# Patient Record
Sex: Female | Born: 1990 | Marital: Married | Smoking: Never smoker
Health system: Southern US, Community
[De-identification: ages and names within clinical notes are randomized; demographics above are authoritative.]

## PROBLEM LIST (undated history)

## (undated) DIAGNOSIS — O24419 Gestational diabetes mellitus in pregnancy, unspecified control: Secondary | ICD-10-CM

## (undated) HISTORY — DX: Gestational diabetes mellitus in pregnancy, unspecified control: O24.419

---

## 2019-02-14 DIAGNOSIS — L253 Unspecified contact dermatitis due to other chemical products: Secondary | ICD-10-CM | POA: Diagnosis not present

## 2019-04-09 DIAGNOSIS — Z20828 Contact with and (suspected) exposure to other viral communicable diseases: Secondary | ICD-10-CM | POA: Diagnosis not present

## 2019-09-15 DIAGNOSIS — U071 COVID-19: Secondary | ICD-10-CM | POA: Diagnosis not present

## 2019-11-17 DIAGNOSIS — Z6825 Body mass index (BMI) 25.0-25.9, adult: Secondary | ICD-10-CM | POA: Diagnosis not present

## 2019-11-17 DIAGNOSIS — Z113 Encounter for screening for infections with a predominantly sexual mode of transmission: Secondary | ICD-10-CM | POA: Diagnosis not present

## 2019-11-17 DIAGNOSIS — Z0000 Encounter for general adult medical examination without abnormal findings: Secondary | ICD-10-CM | POA: Diagnosis not present

## 2019-11-17 DIAGNOSIS — Z124 Encounter for screening for malignant neoplasm of cervix: Secondary | ICD-10-CM | POA: Diagnosis not present

## 2019-11-22 DIAGNOSIS — Z23 Encounter for immunization: Secondary | ICD-10-CM | POA: Diagnosis not present

## 2020-03-01 ENCOUNTER — Encounter: Payer: Self-pay | Admitting: Advanced Practice Midwife

## 2020-03-01 ENCOUNTER — Ambulatory Visit (INDEPENDENT_AMBULATORY_CARE_PROVIDER_SITE_OTHER): Payer: BC Managed Care – PPO | Admitting: Advanced Practice Midwife

## 2020-03-01 ENCOUNTER — Other Ambulatory Visit: Payer: Self-pay

## 2020-03-01 ENCOUNTER — Other Ambulatory Visit (HOSPITAL_COMMUNITY)
Admission: RE | Admit: 2020-03-01 | Discharge: 2020-03-01 | Disposition: A | Discharge status: Home / Self Care | Payer: BC Managed Care – PPO | Source: Ambulatory Visit | Attending: Advanced Practice Midwife | Admitting: Advanced Practice Midwife

## 2020-03-01 DIAGNOSIS — Z3401 Encounter for supervision of normal first pregnancy, first trimester: Secondary | ICD-10-CM

## 2020-03-01 DIAGNOSIS — Z3A08 8 weeks gestation of pregnancy: Secondary | ICD-10-CM | POA: Diagnosis not present

## 2020-03-01 DIAGNOSIS — Z3400 Encounter for supervision of normal first pregnancy, unspecified trimester: Secondary | ICD-10-CM | POA: Insufficient documentation

## 2020-03-01 NOTE — Patient Instructions (Signed)
Genetic Testing During Pregnancy Genetic testing during pregnancy is also called prenatal genetic testing. This type of testing can determine if your baby is at risk of being born with a disorder caused by abnormal genes or chromosomes (genetic disorder). Chromosomes contain genes that control how your baby will develop in your womb. There are many different genetic disorders. Examples of genetic disorders that may be found through genetic testing include Down syndrome and cystic fibrosis. Gene changes (mutations) can be passed down through families. Genetic testing is offered to all women before or during pregnancy. You can choose whether to have genetic testing. Why is genetic testing done? Genetic testing is done during pregnancy to find out whether your child is at risk for a genetic disorder. Having genetic testing allows you to:  Discuss your test results and options with a genetic counselor.  Prepare for a baby that may be born with a genetic disorder. Learning about the disorder ahead of time helps you be better prepared to manage it. Your health care providers can also be prepared in case your baby requires special care before or after birth.  Consider whether you want to continue with the pregnancy. In some cases, genetic testing may be done to learn about the traits a child will inherit. Types of genetic tests There are two basic types of genetic testing. Screening tests indicate whether your developing baby (fetus) is at higher risk for a genetic disorder. Diagnostic tests check actual fetal cells to diagnose a genetic disorder. Screening tests     Screening tests will not harm your baby. They are recommended for all pregnant women. Types of screening tests include:  Carrier screening. This test involves checking genes from both parents by testing their blood or saliva. The test checks to find out if the parents carry a genetic mutation that may be passed to a baby. In most cases,  both parents must carry the mutation for a baby to be at risk.  First trimester screening. This test combines a blood test with sound wave imaging of your baby (fetal ultrasound). This screening test checks for a risk of Down syndrome or other defects caused by having extra chromosomes. It also checks for defects of the heart, abdomen, or skeleton.  Second trimester screening also combines a blood test with a fetal ultrasound exam. It checks for a risk of genetic defects of the face, brain, spine, heart, or limbs.  Combined or sequential screening. This type of testing combines the results of first and second trimester screening. This type of testing may be more accurate than first or second trimester screening alone.  Cell-free DNA testing. This is a blood test that detects cells released by the placenta that get into the mother's blood. It can be used to check for a risk of Down syndrome, other extra chromosome syndromes, and disorders caused by abnormal numbers of sex chromosomes. This test can be done any time after 10 weeks of pregnancy.  Diagnostic tests Diagnostic tests carry slight risks of problems, including bleeding, infection, and loss of the pregnancy. These tests are done only if your baby is at risk for a genetic disorder. You may meet with a genetic counselor to discuss the risks and benefits before having diagnostic tests. Examples of diagnostic tests include:  Chorionic villus sampling (CVS). This involves a procedure to remove and test a sample of cells taken from the placenta. The procedure may be done between 10 and 12 weeks of pregnancy.  Amniocentesis. This involves a   procedure to remove and test a sample of fluid (amniotic fluid) and cells from the sac that surrounds the developing baby. The procedure may be done between 15 and 20 weeks of pregnancy. What do the results mean? For a screening test:  If the results are negative, it often means that your child is not at higher  risk. There is still a slight chance your child could have a genetic disorder.  If the results are positive, it does not mean your child will have a genetic disorder. It may mean that your child has a higher-than-normal risk for a genetic disorder. In that case, you may want to talk with a genetic counselor about whether you should have diagnostic genetic tests. For a diagnostic test:  If the result is negative, it is unlikely that your child will have a genetic disorder.  If the test is positive for a genetic disorder, it is likely that your child will have the disorder. The test may not tell how severe the disorder will be. Talk with your health care provider about your options. Questions to ask your health care provider Before talking to your health care provider about genetic testing, find out if there is a history of genetic disorders in your family. It may also help to know your family's ethnic origins. Then ask your health care provider the following questions:  Is my baby at risk for a genetic disorder?  What are the benefits of having genetic screening?  What tests are best for me and my baby?  What are the risks of each test?  If I get a positive result on a screening test, what is the next step?  Should I meet with a genetic counselor before having a diagnostic test?  Should my partner or other members of my family be tested?  How much do the tests cost? Will my insurance cover the testing? Summary  Genetic testing is done during pregnancy to find out whether your child is at risk for a genetic disorder.  Genetic testing is offered to all women before or during pregnancy. You can choose whether to have genetic testing.  There are two basic types of genetic testing. Screening tests indicate whether your developing baby (fetus) is at higher risk for a genetic disorder. Diagnostic tests check actual fetal cells to diagnose a genetic disorder.  If a diagnostic genetic test is  positive, talk with your health care provider about your options. This information is not intended to replace advice given to you by your health care provider. Make sure you discuss any questions you have with your health care provider. Document Revised: 12/18/2018 Document Reviewed: 11/11/2017 Elsevier Patient Education  2020 [employer] First Trimester of Pregnancy The first trimester of pregnancy is from week 1 until the end of week 13 (months 1 through 3). A week after a sperm fertilizes an egg, the egg will implant on the wall of the uterus. This embryo will begin to develop into a baby. Genes from you and your partner will form the baby. The female genes will determine whether the baby will be a boy or a girl. At 6-8 weeks, the eyes and face will be formed, and the heartbeat can be seen on ultrasound. At the end of 12 weeks, all the baby's organs will be formed. Now that you are pregnant, you will want to do everything you can to have a healthy baby. Two of the most important things are to get good prenatal care and to   follow your health care provider's instructions. Prenatal care is all the medical care you receive before the baby's birth. This care will help prevent, find, and treat any problems during the pregnancy and childbirth. Body changes during your first trimester Your body goes through many changes during pregnancy. The changes vary from woman to woman.  You may gain or lose a couple of pounds at first.  You may feel sick to your stomach (nauseous) and you may throw up (vomit). If the vomiting is uncontrollable, call your health care provider.  You may tire easily.  You may develop headaches that can be relieved by medicines. All medicines should be approved by your health care provider.  You may urinate more often. Painful urination may mean you have a bladder infection.  You may develop heartburn as a result of your pregnancy.  You may develop constipation because certain  hormones are causing the muscles that push stool through your intestines to slow down.  You may develop hemorrhoids or swollen veins (varicose veins).  Your breasts may begin to grow larger and become tender. Your nipples may stick out more, and the tissue that surrounds them (areola) may become darker.  Your gums may bleed and may be sensitive to brushing and flossing.  Dark spots or blotches (chloasma, mask of pregnancy) may develop on your face. This will likely fade after the baby is born.  Your menstrual periods will stop.  You may have a loss of appetite.  You may develop cravings for certain kinds of food.  You may have changes in your emotions from day to day, such as being excited to be pregnant or being concerned that something may go wrong with the pregnancy and baby.  You may have more vivid and strange dreams.  You may have changes in your hair. These can include thickening of your hair, rapid growth, and changes in texture. Some women also have hair loss during or after pregnancy, or hair that feels dry or thin. Your hair will most likely return to normal after your baby is born. What to expect at prenatal visits During a routine prenatal visit:  You will be weighed to make sure you and the baby are growing normally.  Your blood pressure will be taken.  Your abdomen will be measured to track your baby's growth.  The fetal heartbeat will be listened to between weeks 10 and 14 of your pregnancy.  Test results from any previous visits will be discussed. Your health care provider may ask you:  How you are feeling.  If you are feeling the baby move.  If you have had any abnormal symptoms, such as leaking fluid, bleeding, severe headaches, or abdominal cramping.  If you are using any tobacco products, including cigarettes, chewing tobacco, and electronic cigarettes.  If you have any questions. Other tests that may be performed during your first trimester  include:  Blood tests to find your blood type and to check for the presence of any previous infections. The tests will also be used to check for low iron levels (anemia) and protein on red blood cells (Rh antibodies). Depending on your risk factors, or if you previously had diabetes during pregnancy, you may have tests to check for high blood sugar that affects pregnant women (gestational diabetes).  Urine tests to check for infections, diabetes, or protein in the urine.  An ultrasound to confirm the proper growth and development of the baby.  Fetal screens for spinal cord problems (spina bifida) and   Down syndrome.  HIV (human immunodeficiency virus) testing. Routine prenatal testing includes screening for HIV, unless you choose not to have this test.  You may need other tests to make sure you and the baby are doing well. Follow these instructions at home: Medicines  Follow your health care provider's instructions regarding medicine use. Specific medicines may be either safe or unsafe to take during pregnancy.  Take a prenatal vitamin that contains at least 600 micrograms (mcg) of folic acid.  If you develop constipation, try taking a stool softener if your health care provider approves. Eating and drinking   Eat a balanced diet that includes fresh fruits and vegetables, whole grains, good sources of protein such as meat, eggs, or tofu, and low-fat dairy. Your health care provider will help you determine the amount of weight gain that is right for you.  Avoid raw meat and uncooked cheese. These carry germs that can cause birth defects in the baby.  Eating four or five small meals rather than three large meals a day may help relieve nausea and vomiting. If you start to feel nauseous, eating a few soda crackers can be helpful. Drinking liquids between meals, instead of during meals, also seems to help ease nausea and vomiting.  Limit foods that are high in fat and processed sugars, such  as fried and sweet foods.  To prevent constipation: ? Eat foods that are high in fiber, such as fresh fruits and vegetables, whole grains, and beans. ? Drink enough fluid to keep your urine clear or pale yellow. Activity  Exercise only as directed by your health care provider. Most women can continue their usual exercise routine during pregnancy. Try to exercise for 30 minutes at least 5 days a week. Exercising will help you: ? Control your weight. ? Stay in shape. ? Be prepared for labor and delivery.  Experiencing pain or cramping in the lower abdomen or lower back is a good sign that you should stop exercising. Check with your health care provider before continuing with normal exercises.  Try to avoid standing for long periods of time. Move your legs often if you must stand in one place for a long time.  Avoid heavy lifting.  Wear low-heeled shoes and practice good posture.  You may continue to have sex unless your health care provider tells you not to. Relieving pain and discomfort  Wear a good support bra to relieve breast tenderness.  Take warm sitz baths to soothe any pain or discomfort caused by hemorrhoids. Use hemorrhoid cream if your health care provider approves.  Rest with your legs elevated if you have leg cramps or low back pain.  If you develop varicose veins in your legs, wear support hose. Elevate your feet for 15 minutes, 3-4 times a day. Limit salt in your diet. Prenatal care  Schedule your prenatal visits by the twelfth week of pregnancy. They are usually scheduled monthly at first, then more often in the last 2 months before delivery.  Write down your questions. Take them to your prenatal visits.  Keep all your prenatal visits as told by your health care provider. This is important. Safety  Wear your seat belt at all times when driving.  Make a list of emergency phone numbers, including numbers for family, friends, the hospital, and police and fire  departments. General instructions  Ask your health care provider for a referral to a local prenatal education class. Begin classes no later than the beginning of month 6 of your   pregnancy.  Ask for help if you have counseling or nutritional needs during pregnancy. Your health care provider can offer advice or refer you to specialists for help with various needs.  Do not use hot tubs, steam rooms, or saunas.  Do not douche or use tampons or scented sanitary pads.  Do not cross your legs for long periods of time.  Avoid cat litter boxes and soil used by cats. These carry germs that can cause birth defects in the baby and possibly loss of the fetus by miscarriage or stillbirth.  Avoid all smoking, herbs, alcohol, and medicines not prescribed by your health care provider. Chemicals in these products affect the formation and growth of the baby.  Do not use any products that contain nicotine or tobacco, such as cigarettes and e-cigarettes. If you need help quitting, ask your health care provider. You may receive counseling support and other resources to help you quit.  Schedule a dentist appointment. At home, brush your teeth with a soft toothbrush and be gentle when you floss. Contact a health care provider if:  You have dizziness.  You have mild pelvic cramps, pelvic pressure, or nagging pain in the abdominal area.  You have persistent nausea, vomiting, or diarrhea.  You have a bad smelling vaginal discharge.  You have pain when you urinate.  You notice increased swelling in your face, hands, legs, or ankles.  You are exposed to fifth disease or chickenpox.  You are exposed to German measles (rubella) and have never had it. Get help right away if:  You have a fever.  You are leaking fluid from your vagina.  You have spotting or bleeding from your vagina.  You have severe abdominal cramping or pain.  You have rapid weight gain or loss.  You vomit blood or material that  looks like coffee grounds.  You develop a severe headache.  You have shortness of breath.  You have any kind of trauma, such as from a fall or a car accident. Summary  The first trimester of pregnancy is from week 1 until the end of week 13 (months 1 through 3).  Your body goes through many changes during pregnancy. The changes vary from woman to woman.  You will have routine prenatal visits. During those visits, your health care provider will examine you, discuss any test results you may have, and talk with you about how you are feeling. This information is not intended to replace advice given to you by your health care provider. Make sure you discuss any questions you have with your health care provider. Document Revised: 08/09/2017 Document Reviewed: 08/08/2016 Elsevier Patient Education  2020 [employer] Inc[employer]  

## 2020-03-01 NOTE — Progress Notes (Signed)
byDATING AND VIABILITY SONOGRAM   Patricia Delgado is a 29 y.o. year old No obstetric history on file. with LMP 12-28-2019 which would correlate to [redacted]w[redacted]d gestation.  She has regular menstrual cycles.   She is here today for a confirmatory initial sonogram.    GESTATION: SINGLETON   FETAL ACTIVITY:          Heart rate        172 bpm          The fetus is active.   Did note a 4.48 cm pocket of fluid anterior to the gestational sac. Patient did note she did have some dark brown blood on one occurrence.    GESTATIONAL AGE AND  BIOMETRICS:  Gestational criteria: Estimated Date of Delivery: None noted. by LMP now at 9-1  Previous Scans:0  GESTATIONAL SAC           1.72 cm         8-1 weeks  CROWN RUMP LENGTH           cm         8-0 weeks                                                                               AVERAGE EGA(BY THIS SCAN): 8-1 weeks  WORKING EDD( LMP ):  10-03-2020     TECHNICIAN COMMENTS: Patient informed that the ultrasound is considered a limited obstetric ultrasound and is not intended to be a complete ultrasound exam. Patient also informed that the ultrasound is not being completed with the intent of assessing for fetal or placental anomalies or any pelvic abnormalities. Explained that the purpose of today's ultrasound is to assess for fetal heart rate. Patient acknowledges the purpose of the exam and the limitations of the study.     Armandina Stammer 03/01/2020 9:21 AM   Patient answered the Get ready [place] questionnaire on the ipad. Armandina Stammer RN

## 2020-03-01 NOTE — Progress Notes (Signed)
°  Subjective:    Patricia Delgado is a G1P0 [redacted]w[redacted]d being seen today for her first obstetrical visit.  Her obstetrical history is significant for primigravida. Patient does intend to breast feed. Pregnancy history fully reviewed.  Patient reports nausea.  Vitals:   03/01/20 0929 03/01/20 0929  BP: 113/67   Pulse: 77   Weight: 130 lb (59 kg)   Height:  4\' 10"  (1.473 m)    HISTORY: OB History  Gravida Para Term Preterm AB Living  1            SAB TAB Ectopic Multiple Live Births               # Outcome Date GA Lbr Len/2nd Weight Sex Delivery Anes PTL Lv  1 Current            History reviewed. No pertinent past medical history. History reviewed. No pertinent surgical history. Family History  Problem Relation Age of Onset   Hypertension Maternal Grandfather    Stroke Maternal Grandfather    Diabetes Paternal Grandmother    Cancer Neg Hx      Exam    Uterus:     Pelvic Exam:    Perineum: No Hemorrhoids, Normal Perineum   Vulva: Bartholin's, Urethra, Skene's normal   Vagina:  normal discharge   pH:    Cervix: no cervical motion tenderness   Adnexa: no mass, fullness, tenderness   Bony Pelvis: gynecoid  System: Breast:  normal appearance, no masses or tenderness  Bilateral accessory nipples just below breasts   Skin: normal coloration and turgor, no rashes    Neurologic: oriented, grossly non-focal   Extremities: normal strength, tone, and muscle mass   HEENT neck supple with midline trachea   Mouth/Teeth mucous membranes moist, pharynx normal without lesions   Neck supple and no masses   Cardiovascular: regular rate and rhythm   Respiratory:  appears well, vitals normal, no respiratory distress, acyanotic, normal RR, ear and throat exam is normal, neck free of mass or lymphadenopathy, chest clear, no wheezing, crepitations, rhonchi, normal symmetric air entry   Abdomen: soft, non-tender; bowel sounds normal; no masses,  no organomegaly   Urinary: urethral meatus normal       Assessment:    Pregnancy: G1P0 Patient Active Problem List   Diagnosis Date Noted   Supervision of normal first pregnancy 03/01/2020        Plan:    Problem list reviewed and updated. Genetic Screening discussed Panorama/AFP: requested.  Ultrasound discussed; fetal survey: requested.  Follow up in 4 weeks. 50% of 30 min visit spent on counseling and coordination of care.    Initial labs drawn. Continue prenatal vitamins. Discussed and offered genetic screening options, including Quad screen/AFP, NIPS testing, and option to decline testing. Benefits/risks/alternatives reviewed. Pt aware that anatomy 03/03/2020 is form of genetic screening with lower accuracy in detecting trisomies than blood work.  Pt chooses genetic screening today. NIPS: ordered. Ultrasound discussed; fetal anatomic survey: ordered. Problem list reviewed and updated. The nature of [facility] with multiple MDs and other Advanced Practice Providers was explained to patient; also emphasized that residents, students are part of our team. Routine obstetric precautions reviewed.    [facility] 03/01/2020

## 2020-03-02 LAB — GC/CHLAMYDIA PROBE AMP (~~LOC~~) NOT AT ARMC
Chlamydia: NEGATIVE
Comment: NEGATIVE
Comment: NORMAL
Neisseria Gonorrhea: NEGATIVE

## 2020-03-03 LAB — URINE CULTURE: Organism ID, Bacteria: NO GROWTH

## 2020-03-03 LAB — CBC/D/PLT+RPR+RH+ABO+RUB AB...
Antibody Screen: NEGATIVE
Basophils Absolute: 0 10*3/uL (ref 0.0–0.2)
Basos: 0 %
EOS (ABSOLUTE): 0 10*3/uL (ref 0.0–0.4)
Eos: 0 %
HCV Ab: <0.1 s/co ratio (ref 0.0–0.9)
HIV Screen 4th Generation wRfx: NONREACTIVE
Hematocrit: 40.1 % (ref 34.0–46.6)
Hemoglobin: 13.2 g/dL (ref 11.1–15.9)
Hepatitis B Surface Ag: NEGATIVE
Immature Grans (Abs): 0.1 10*3/uL (ref 0.0–0.1)
Immature Granulocytes: 1 %
Lymphocytes Absolute: 2.2 10*3/uL (ref 0.7–3.1)
Lymphs: 23 %
MCH: 29.6 pg (ref 26.6–33.0)
MCHC: 32.9 g/dL (ref 31.5–35.7)
MCV: 90 fL (ref 79–97)
Monocytes Absolute: 0.5 10*3/uL (ref 0.1–0.9)
Monocytes: 6 %
Neutrophils Absolute: 6.6 10*3/uL (ref 1.4–7.0)
Neutrophils: 70 %
Platelets: 293 10*3/uL (ref 150–450)
RBC: 4.46 x10E6/uL (ref 3.77–5.28)
RDW: 12.5 % (ref 11.7–15.4)
RPR Ser Ql: NONREACTIVE
Rh Factor: POSITIVE
Rubella Antibodies, IGG: 1.19 index (ref >0.99)
WBC: 9.5 10*3/uL (ref 3.4–10.8)

## 2020-03-03 LAB — HCV INTERPRETATION

## 2020-03-08 ENCOUNTER — Encounter: Payer: Self-pay | Admitting: Advanced Practice Midwife

## 2020-03-29 ENCOUNTER — Ambulatory Visit (INDEPENDENT_AMBULATORY_CARE_PROVIDER_SITE_OTHER): Payer: BC Managed Care – PPO | Admitting: Advanced Practice Midwife

## 2020-03-29 ENCOUNTER — Other Ambulatory Visit: Payer: Self-pay

## 2020-03-29 ENCOUNTER — Encounter: Payer: Self-pay | Admitting: Advanced Practice Midwife

## 2020-03-29 VITALS — BP 109/59 | HR 79 | Wt 126.0 lb

## 2020-03-29 DIAGNOSIS — Z3400 Encounter for supervision of normal first pregnancy, unspecified trimester: Secondary | ICD-10-CM

## 2020-03-29 DIAGNOSIS — Z3482 Encounter for supervision of other normal pregnancy, second trimester: Secondary | ICD-10-CM | POA: Diagnosis not present

## 2020-03-29 DIAGNOSIS — Z3401 Encounter for supervision of normal first pregnancy, first trimester: Secondary | ICD-10-CM

## 2020-03-29 DIAGNOSIS — Z3A13 13 weeks gestation of pregnancy: Secondary | ICD-10-CM

## 2020-03-29 NOTE — Patient Instructions (Addendum)
Genetic Testing During Pregnancy Genetic testing during pregnancy is also called prenatal genetic testing. This type of testing can determine if your baby is at risk of being born with a disorder caused by abnormal genes or chromosomes (genetic disorder). Chromosomes contain genes that control how your baby will develop in your womb. There are many different genetic disorders. Examples of genetic disorders that may be found through genetic testing include Down syndrome and cystic fibrosis. Gene changes (mutations) can be passed down through families. Genetic testing is offered to all women before or during pregnancy. You can choose whether to have genetic testing. Why is genetic testing done? Genetic testing is done during pregnancy to find out whether your child is at risk for a genetic disorder. Having genetic testing allows you to:  Discuss your test results and options with a genetic counselor.  Prepare for a baby that may be born with a genetic disorder. Learning about the disorder ahead of time helps you be better prepared to manage it. Your health care providers can also be prepared in case your baby requires special care before or after birth.  Consider whether you want to continue with the pregnancy. In some cases, genetic testing may be done to learn about the traits a child will inherit. Types of genetic tests There are two basic types of genetic testing. Screening tests indicate whether your developing baby (fetus) is at higher risk for a genetic disorder. Diagnostic tests check actual fetal cells to diagnose a genetic disorder. Screening tests     Screening tests will not harm your baby. They are recommended for all pregnant women. Types of screening tests include:  Carrier screening. This test involves checking genes from both parents by testing their blood or saliva. The test checks to find out if the parents carry a genetic mutation that may be passed to a baby. In most cases,  both parents must carry the mutation for a baby to be at risk.  First trimester screening. This test combines a blood test with sound wave imaging of your baby (fetal ultrasound). This screening test checks for a risk of Down syndrome or other defects caused by having extra chromosomes. It also checks for defects of the heart, abdomen, or skeleton.  Second trimester screening also combines a blood test with a fetal ultrasound exam. It checks for a risk of genetic defects of the face, brain, spine, heart, or limbs.  Combined or sequential screening. This type of testing combines the results of first and second trimester screening. This type of testing may be more accurate than first or second trimester screening alone.  Cell-free DNA testing. This is a blood test that detects cells released by the placenta that get into the mother's blood. It can be used to check for a risk of Down syndrome, other extra chromosome syndromes, and disorders caused by abnormal numbers of sex chromosomes. This test can be done any time after 10 weeks of pregnancy.  Diagnostic tests Diagnostic tests carry slight risks of problems, including bleeding, infection, and loss of the pregnancy. These tests are done only if your baby is at risk for a genetic disorder. You may meet with a genetic counselor to discuss the risks and benefits before having diagnostic tests. Examples of diagnostic tests include:  Chorionic villus sampling (CVS). This involves a procedure to remove and test a sample of cells taken from the placenta. The procedure may be done between 10 and 12 weeks of pregnancy.  Amniocentesis. This involves a  procedure to remove and test a sample of fluid (amniotic fluid) and cells from the sac that surrounds the developing baby. The procedure may be done between 15 and 20 weeks of pregnancy. What do the results mean? For a screening test:  If the results are negative, it often means that your child is not at higher  risk. There is still a slight chance your child could have a genetic disorder.  If the results are positive, it does not mean your child will have a genetic disorder. It may mean that your child has a higher-than-normal risk for a genetic disorder. In that case, you may want to talk with a genetic counselor about whether you should have diagnostic genetic tests. For a diagnostic test:  If the result is negative, it is unlikely that your child will have a genetic disorder.  If the test is positive for a genetic disorder, it is likely that your child will have the disorder. The test may not tell how severe the disorder will be. Talk with your health care provider about your options. Questions to ask your health care provider Before talking to your health care provider about genetic testing, find out if there is a history of genetic disorders in your family. It may also help to know your family's ethnic origins. Then ask your health care provider the following questions:  Is my baby at risk for a genetic disorder?  What are the benefits of having genetic screening?  What tests are best for me and my baby?  What are the risks of each test?  If I get a positive result on a screening test, what is the next step?  Should I meet with a genetic counselor before having a diagnostic test?  Should my partner or other members of my family be tested?  How much do the tests cost? Will my insurance cover the testing? Summary  Genetic testing is done during pregnancy to find out whether your child is at risk for a genetic disorder.  Genetic testing is offered to all women before or during pregnancy. You can choose whether to have genetic testing.  There are two basic types of genetic testing. Screening tests indicate whether your developing baby (fetus) is at higher risk for a genetic disorder. Diagnostic tests check actual fetal cells to diagnose a genetic disorder.  If a diagnostic genetic test is  positive, talk with your health care provider about your options. This information is not intended to replace advice given to you by your health care provider. Make sure you discuss any questions you have with your health care provider. Document Revised: 12/18/2018 Document Reviewed: 11/11/2017 Elsevier Patient Education  2020 [employer]. Second Trimester of Pregnancy  The second trimester is from week 14 through week 27 (month 4 through 6). This is often the time in pregnancy that you feel your best. Often times, morning sickness has lessened or quit. You may have more energy, and you may get hungry more often. Your unborn baby is growing rapidly. At the end of the sixth month, he or she is about 9 inches long and weighs about 1 pounds. You will likely feel the baby move between 18 and 20 weeks of pregnancy. Follow these instructions at home: Medicines  Take over-the-counter and prescription medicines only as told by your doctor. Some medicines are safe and some medicines are not safe during pregnancy.  Take a prenatal vitamin that contains at least 600 micrograms (mcg) of folic acid.  If  you have trouble pooping (constipation), take medicine that will make your stool soft (stool softener) if your doctor approves. Eating and drinking   Eat regular, healthy meals.  Avoid raw meat and uncooked cheese.  If you get low calcium from the food you eat, talk to your doctor about taking a daily calcium supplement.  Avoid foods that are high in fat and sugars, such as fried and sweet foods.  If you feel sick to your stomach (nauseous) or throw up (vomit): ? Eat 4 or 5 small meals a day instead of 3 large meals. ? Try eating a few soda crackers. ? Drink liquids between meals instead of during meals.  To prevent constipation: ? Eat foods that are high in fiber, like fresh fruits and vegetables, whole grains, and beans. ? Drink enough fluids to keep your pee (urine) clear or pale  yellow. Activity  Exercise only as told by your doctor. Stop exercising if you start to have cramps.  Do not exercise if it is too hot, too humid, or if you are in a place of great height (high altitude).  Avoid heavy lifting.  Wear low-heeled shoes. Sit and stand up straight.  You can continue to have sex unless your doctor tells you not to. Relieving pain and discomfort  Wear a good support bra if your breasts are tender.  Take warm water baths (sitz baths) to soothe pain or discomfort caused by hemorrhoids. Use hemorrhoid cream if your doctor approves.  Rest with your legs raised if you have leg cramps or low back pain.  If you develop puffy, bulging veins (varicose veins) in your legs: ? Wear support hose or compression stockings as told by your doctor. ? Raise (elevate) your feet for 15 minutes, 3-4 times a day. ? Limit salt in your food. Prenatal care  Write down your questions. Take them to your prenatal visits.  Keep all your prenatal visits as told by your doctor. This is important. Safety  Wear your seat belt when driving.  Make a list of emergency phone numbers, including numbers for family, friends, the hospital, and police and fire departments. General instructions  Ask your doctor about the right foods to eat or for help finding a counselor, if you need these services.  Ask your doctor about local prenatal classes. Begin classes before month 6 of your pregnancy.  Do not use hot tubs, steam rooms, or saunas.  Do not douche or use tampons or scented sanitary pads.  Do not cross your legs for long periods of time.  Visit your dentist if you have not done so. Use a soft toothbrush to brush your teeth. Floss gently.  Avoid all smoking, herbs, and alcohol. Avoid drugs that are not approved by your doctor.  Do not use any products that contain nicotine or tobacco, such as cigarettes and e-cigarettes. If you need help quitting, ask your doctor.  Avoid cat  litter boxes and soil used by cats. These carry germs that can cause birth defects in the baby and can cause a loss of your baby (miscarriage) or stillbirth. Contact a doctor if:  You have mild cramps or pressure in your lower belly.  You have pain when you pee (urinate).  You have bad smelling fluid coming from your vagina.  You continue to feel sick to your stomach (nauseous), throw up (vomit), or have watery poop (diarrhea).  You have a nagging pain in your belly area.  You feel dizzy. Get help right away if:    You have a fever.  You are leaking fluid from your vagina.  You have spotting or bleeding from your vagina.  You have severe belly cramping or pain.  You lose or gain weight rapidly.  You have trouble catching your breath and have chest pain.  You notice sudden or extreme puffiness (swelling) of your face, hands, ankles, feet, or legs.  You have not felt the baby move in over an hour.  You have severe headaches that do not go away when you take medicine.  You have trouble seeing. Summary  The second trimester is from week 14 through week 27 (months 4 through 6). This is often the time in pregnancy that you feel your best.  To take care of yourself and your unborn baby, you will need to eat healthy meals, take medicines only if your doctor tells you to do so, and do activities that are safe for you and your baby.  Call your doctor if you get sick or if you notice anything unusual about your pregnancy. Also, call your doctor if you need help with the right food to eat, or if you want to know what activities are safe for you. This information is not intended to replace advice given to you by your health care provider. Make sure you discuss any questions you have with your health care provider. Document Revised: 12/19/2018 Document Reviewed: 10/02/2016 Elsevier Patient Education  2020 [employer].

## 2020-03-29 NOTE — Progress Notes (Signed)
   PRENATAL VISIT NOTE  Subjective:  Patricia Delgado is a 29 y.o. G1P0 at [redacted]w[redacted]d being seen today for ongoing prenatal care.  She is currently monitored for the following issues for this low-risk pregnancy and has Supervision of normal first pregnancy on their problem list.  Patient reports nausea. Nausea is much less than before  Contractions: Not present. Vag. Bleeding: None.  Movement: Absent. Denies leaking of fluid.   The following portions of the patient's history were reviewed and updated as appropriate: allergies, current medications, past family history, past medical history, past social history, past surgical history and problem list.   Objective:   Vitals:   03/29/20 0922  BP: (!) 109/59  Pulse: 79  Weight: 126 lb (57.2 kg)    Fetal Status: Fetal Heart Rate (bpm): 152 Fundal Height: 13 cm Movement: Absent     General:  Alert, oriented and cooperative. Patient is in no acute distress.  Skin: Skin is warm and dry. No rash noted.   Cardiovascular: Normal heart rate noted  Respiratory: Normal respiratory effort, no problems with respiration noted  Abdomen: Soft, gravid, appropriate for gestational age.  Pain/Pressure: Absent     Pelvic: Cervical exam deferred        Extremities: Normal range of motion.  Edema: None  Mental Status: Normal mood and affect. Normal behavior. Normal judgment and thought content.   Assessment and Plan:  Pregnancy: G1P0 at [redacted]w[redacted]d 1. Supervision of normal first pregnancy, antepartum     Panorama today     AFP only next visit  - Genetic Screening - US MFM OB COMP + 14 WK; Future  Preterm labor symptoms and general obstetric precautions including but not limited to vaginal bleeding, contractions, leaking of fluid and fetal movement were reviewed in detail with the patient. Please refer to After Visit Summary for other counseling recommendations.   Return in about 4 weeks (around 04/26/2020), or 4-5 wks, for [facility].  Wynelle Bourgeois,  CNM

## 2020-04-05 ENCOUNTER — Telehealth: Payer: Self-pay

## 2020-04-05 NOTE — Telephone Encounter (Signed)
Pt called requesting Panorama results. Pt made aware that her results are low risk. Understanding was voiced. Pt did not want to know the gender.  Keleigh Kazee l Idil Maslanka, CMA

## 2020-04-21 DIAGNOSIS — Z03818 Encounter for observation for suspected exposure to other biological agents ruled out: Secondary | ICD-10-CM | POA: Diagnosis not present

## 2020-04-26 ENCOUNTER — Encounter: Payer: Self-pay | Admitting: Advanced Practice Midwife

## 2020-04-26 ENCOUNTER — Ambulatory Visit (INDEPENDENT_AMBULATORY_CARE_PROVIDER_SITE_OTHER): Payer: BC Managed Care – PPO | Admitting: Advanced Practice Midwife

## 2020-04-26 ENCOUNTER — Other Ambulatory Visit: Payer: Self-pay

## 2020-04-26 VITALS — BP 108/56 | HR 85 | Wt 128.0 lb

## 2020-04-26 DIAGNOSIS — Z3A17 17 weeks gestation of pregnancy: Secondary | ICD-10-CM | POA: Diagnosis not present

## 2020-04-26 DIAGNOSIS — Z3400 Encounter for supervision of normal first pregnancy, unspecified trimester: Secondary | ICD-10-CM

## 2020-04-26 NOTE — Progress Notes (Signed)
   PRENATAL VISIT NOTE  Subjective:  Patricia Delgado is a 29 y.o. G1P0 at [redacted]w[redacted]d being seen today for ongoing prenatal care.  She is currently monitored for the following issues for this low-risk pregnancy and has Supervision of normal first pregnancy on their problem list.  Patient reports occ itching in nipples.  Contractions: Not present. Vag. Bleeding: None.  Movement: Absent. Denies leaking of fluid.   The following portions of the patient's history were reviewed and updated as appropriate: allergies, current medications, past family history, past medical history, past social history, past surgical history and problem list.   Objective:   Vitals:   04/26/20 0912  BP: (!) 108/56  Pulse: 85  Weight: 128 lb (58.1 kg)    Fetal Status: Fetal Heart Rate (bpm): 150   Movement: Absent     General:  Alert, oriented and cooperative. Patient is in no acute distress.  Skin: Skin is warm and dry. No rash noted.   Cardiovascular: Normal heart rate noted  Respiratory: Normal respiratory effort, no problems with respiration noted  Abdomen: Soft, gravid, appropriate for gestational age.  Pain/Pressure: Present     Pelvic: Cervical exam deferred        Extremities: Normal range of motion.  Edema: None  Mental Status: Normal mood and affect. Normal behavior. Normal judgment and thought content.   Assessment and Plan:  Pregnancy: G1P0 at [redacted]w[redacted]d 1. [redacted] weeks gestation of pregnancy      Discussed use of Benadryl or OTC HC cream for itching  - AFP, Serum, Open Spina Bifida - [facility] AMB BABYSCRIPTS OPT IN  2. Supervision of normal first pregnancy, antepartum  - [facility] AMB BABYSCRIPTS OPT IN  3.  The patient was counseled on the potential benefits and lack of known risks of COVID vaccination, during pregnancy and breastfeeding, on today's visit. The patient's questions and concerns were addressed today The patient Has already received vaccine in March 2021.   4.   Prevention of back pain     Family  worried about her bending over. States she squats to pick things up  Back precautions reviewed.   Preterm labor symptoms and general obstetric precautions including but not limited to vaginal bleeding, contractions, leaking of fluid and fetal movement were reviewed in detail with the patient. Please refer to After Visit Summary for other counseling recommendations.   Return in about 4 weeks (around 05/24/2020) for [facility].  Future Appointments  Date Time Provider Department Center  05/09/2020 10:45 AM [facility] US5 [facility] [address] [facility]  05/24/2020  9:10 AM Aviva Signs, CNM [facility] None    Wynelle Bourgeois, CNM

## 2020-04-26 NOTE — Patient Instructions (Signed)

## 2020-04-28 LAB — AFP, SERUM, OPEN SPINA BIFIDA
AFP MoM: 0.69
AFP Value: 30.5 ng/mL
Gest. Age on Collection Date: 17.1 weeks
Maternal Age At EDD: 29.2 yr
OSBR Risk 1 IN: 10000
Test Results:: NEGATIVE
Weight: 128 [lb_av]

## 2020-05-09 ENCOUNTER — Ambulatory Visit: Payer: BC Managed Care – PPO | Attending: Advanced Practice Midwife

## 2020-05-09 ENCOUNTER — Other Ambulatory Visit: Payer: Self-pay | Admitting: Advanced Practice Midwife

## 2020-05-09 ENCOUNTER — Other Ambulatory Visit: Payer: Self-pay

## 2020-05-09 DIAGNOSIS — Z363 Encounter for antenatal screening for malformations: Secondary | ICD-10-CM

## 2020-05-09 DIAGNOSIS — Z3400 Encounter for supervision of normal first pregnancy, unspecified trimester: Secondary | ICD-10-CM | POA: Insufficient documentation

## 2020-05-09 DIAGNOSIS — Z3A19 19 weeks gestation of pregnancy: Secondary | ICD-10-CM | POA: Diagnosis not present

## 2020-05-09 DIAGNOSIS — O358XX0 Maternal care for other (suspected) fetal abnormality and damage, not applicable or unspecified: Secondary | ICD-10-CM

## 2020-05-10 ENCOUNTER — Encounter: Payer: Self-pay | Admitting: Advanced Practice Midwife

## 2020-05-10 DIAGNOSIS — O283 Abnormal ultrasonic finding on antenatal screening of mother: Secondary | ICD-10-CM | POA: Insufficient documentation

## 2020-05-24 ENCOUNTER — Ambulatory Visit (INDEPENDENT_AMBULATORY_CARE_PROVIDER_SITE_OTHER): Payer: BC Managed Care – PPO | Admitting: Advanced Practice Midwife

## 2020-05-24 ENCOUNTER — Encounter: Payer: Self-pay | Admitting: Advanced Practice Midwife

## 2020-05-24 ENCOUNTER — Other Ambulatory Visit: Payer: Self-pay

## 2020-05-24 VITALS — BP 107/60 | HR 92 | Wt 130.0 lb

## 2020-05-24 DIAGNOSIS — Z3A21 21 weeks gestation of pregnancy: Secondary | ICD-10-CM

## 2020-05-24 DIAGNOSIS — Z3400 Encounter for supervision of normal first pregnancy, unspecified trimester: Secondary | ICD-10-CM

## 2020-05-24 DIAGNOSIS — Z23 Encounter for immunization: Secondary | ICD-10-CM

## 2020-05-24 NOTE — Progress Notes (Signed)
° °  PRENATAL VISIT NOTE  Subjective:  Patricia Delgado is a 29 y.o. G1P0 at [redacted]w[redacted]d being seen today for ongoing prenatal care.  She is currently monitored for the following issues for this low-risk pregnancy and has Supervision of normal first pregnancy and Abnormal fetal ultrasound on their problem list.  Patient reports no complaints.  Contractions: Not present. Vag. Bleeding: None.  Movement: Present. Denies leaking of fluid.   The following portions of the patient's history were reviewed and updated as appropriate: allergies, current medications, past family history, past medical history, past social history, past surgical history and problem list.   Objective:   Vitals:   05/24/20 0915  BP: 107/60  Pulse: 92  Weight: 130 lb (59 kg)    Fetal Status: Fetal Heart Rate (bpm): 148   Movement: Present     General:  Alert, oriented and cooperative. Patient is in no acute distress.  Skin: Skin is warm and dry. No rash noted.   Cardiovascular: Normal heart rate noted  Respiratory: Normal respiratory effort, no problems with respiration noted  Abdomen: Soft, gravid, appropriate for gestational age.  Pain/Pressure: Absent     Pelvic: Cervical exam deferred        Extremities: Normal range of motion.  Edema: None  Mental Status: Normal mood and affect. Normal behavior. Normal judgment and thought content.   Assessment and Plan:  Pregnancy: G1P0 at [redacted]w[redacted]d 1. [redacted] weeks gestation of pregnancy     Has alreaday had Covid vaccine in March     Interested in getting Flu vaccine  - Flu Vaccine QUAD 36+ mos IM  2. Supervision of normal first pregnancy, antepartum  Preterm labor symptoms and general obstetric precautions including but not limited to vaginal bleeding, contractions, leaking of fluid and fetal movement were reviewed in detail with the patient. Please refer to After Visit Summary for other counseling recommendations.   Return in about 4 weeks (around 06/21/2020) for [employer].  Future Appointments  Date Time Provider Department Center  06/23/2020  9:15 AM Levie Heritage, DO [facility] None    Wynelle Bourgeois, CNM

## 2020-05-24 NOTE — Patient Instructions (Signed)

## 2020-06-23 ENCOUNTER — Other Ambulatory Visit: Payer: Self-pay

## 2020-06-23 ENCOUNTER — Ambulatory Visit (INDEPENDENT_AMBULATORY_CARE_PROVIDER_SITE_OTHER): Payer: BC Managed Care – PPO | Admitting: Family Medicine

## 2020-06-23 VITALS — BP 102/59 | HR 92 | Wt 135.0 lb

## 2020-06-23 DIAGNOSIS — Z3A25 25 weeks gestation of pregnancy: Secondary | ICD-10-CM

## 2020-06-23 DIAGNOSIS — Z3402 Encounter for supervision of normal first pregnancy, second trimester: Secondary | ICD-10-CM

## 2020-06-23 NOTE — Progress Notes (Signed)
° °  PRENATAL VISIT NOTE  Subjective:  Patricia Delgado is a 29 y.o. G1P0 at [redacted]w[redacted]d being seen today for ongoing prenatal care.  She is currently monitored for the following issues for this high-risk pregnancy and has Supervision of normal first pregnancy and Abnormal fetal ultrasound on their problem list.  Patient reports no complaints.  Contractions: Not present. Vag. Bleeding: None.  Movement: Present. Denies leaking of fluid.   The following portions of the patient's history were reviewed and updated as appropriate: allergies, current medications, past family history, past medical history, past social history, past surgical history and problem list.   Objective:   Vitals:   06/23/20 0922  BP: (!) 102/59  Pulse: 92  Weight: 135 lb (61.2 kg)    Fetal Status: Fetal Heart Rate (bpm): 136   Movement: Present     General:  Alert, oriented and cooperative. Patient is in no acute distress.  Skin: Skin is warm and dry. No rash noted.   Cardiovascular: Normal heart rate noted  Respiratory: Normal respiratory effort, no problems with respiration noted  Abdomen: Soft, gravid, appropriate for gestational age.  Pain/Pressure: Absent     Pelvic: Cervical exam deferred        Extremities: Normal range of motion.  Edema: None  Mental Status: Normal mood and affect. Normal behavior. Normal judgment and thought content.   Assessment and Plan:  Pregnancy: G1P0 at [redacted]w[redacted]d 1. [redacted] weeks gestation of pregnancy  2. Encounter for supervision of normal first pregnancy in second trimester FHT and FH normal.  Preterm labor symptoms and general obstetric precautions including but not limited to vaginal bleeding, contractions, leaking of fluid and fetal movement were reviewed in detail with the patient. Please refer to After Visit Summary for other counseling recommendations.   Return in about 4 weeks (around 07/21/2020) for OB f/u, 2 hr GTT, In Office.  No future appointments.  Levie Heritage, DO

## 2020-06-30 DIAGNOSIS — Z20822 Contact with and (suspected) exposure to covid-19: Secondary | ICD-10-CM | POA: Diagnosis not present

## 2020-07-19 ENCOUNTER — Ambulatory Visit (INDEPENDENT_AMBULATORY_CARE_PROVIDER_SITE_OTHER): Payer: BC Managed Care – PPO | Admitting: Advanced Practice Midwife

## 2020-07-19 ENCOUNTER — Other Ambulatory Visit: Payer: Self-pay

## 2020-07-19 ENCOUNTER — Encounter: Payer: Self-pay | Admitting: Advanced Practice Midwife

## 2020-07-19 VITALS — BP 112/65 | HR 96 | Wt 142.0 lb

## 2020-07-19 DIAGNOSIS — Z3A29 29 weeks gestation of pregnancy: Secondary | ICD-10-CM | POA: Diagnosis not present

## 2020-07-19 DIAGNOSIS — N949 Unspecified condition associated with female genital organs and menstrual cycle: Secondary | ICD-10-CM

## 2020-07-19 DIAGNOSIS — Z23 Encounter for immunization: Secondary | ICD-10-CM

## 2020-07-19 DIAGNOSIS — M5430 Sciatica, unspecified side: Secondary | ICD-10-CM

## 2020-07-19 MED ORDER — COMFORT FIT MATERNITY SUPP MED MISC: 1.0000 | Freq: Every day | 1 refills | Status: AC

## 2020-07-19 MED ORDER — COMFORT FIT MATERNITY SUPP MED MISC
1.0000 | Freq: Every day | 1 refills | Status: DC
Start: 2020-07-19 — End: 2020-07-19

## 2020-07-19 NOTE — Progress Notes (Signed)
   PRENATAL VISIT NOTE  Subjective:  Patricia Delgado is a 29 y.o. G1P0 at [redacted]w[redacted]d being seen today for ongoing prenatal care.  She is currently monitored for the following issues for this low-risk pregnancy and has Supervision of normal first pregnancy and Abnormal fetal ultrasound on their problem list.  Patient reports bilateral intermittent sciatic pain, soreness of round ligaments in morning.  Contractions: Not present. Vag. Bleeding: None.  Movement: Present. Denies leaking of fluid.   The following portions of the patient's history were reviewed and updated as appropriate: allergies, current medications, past family history, past medical history, past social history, past surgical history and problem list.   Objective:   Vitals:   07/19/20 0816  BP: 112/65  Pulse: 96  Weight: 142 lb (64.4 kg)    Fetal Status: Fetal Heart Rate (bpm): 143   Movement: Present     General:  Alert, oriented and cooperative. Patient is in no acute distress.  Skin: Skin is warm and dry. No rash noted.   Cardiovascular: Normal heart rate noted  Respiratory: Normal respiratory effort, no problems with respiration noted  Abdomen: Soft, gravid, appropriate for gestational age.  Pain/Pressure: Absent     Pelvic: Cervical exam deferred        Extremities: Normal range of motion.  Edema: None  Mental Status: Normal mood and affect. Normal behavior. Normal judgment and thought content.   Assessment and Plan:  Pregnancy: G1P0 at [redacted]w[redacted]d 1. [redacted] weeks gestation of pregnancy      Glucola today - Glucose Tolerance, 2 Hours w/1 Hour - RPR - CBC - HIV antibody (with reflex)  2. Sciatic nerve pain, unspecified laterality      Discussed      Stretches  3. Round ligament pain     Pregnancy support belt     Signs of PTL reviewed  Preterm labor symptoms and general obstetric precautions including but not limited to vaginal bleeding, contractions, leaking of fluid and fetal movement were reviewed in detail with the  patient. Please refer to After Visit Summary for other counseling recommendations.   Return in about 2 weeks (around 08/02/2020) for [facility].  Future Appointments  Date Time Provider Department Center  08/02/2020  8:40 AM Aviva Signs, CNM [facility] None    Wynelle Bourgeois, CNM

## 2020-07-19 NOTE — Addendum Note (Signed)
Addended by: Anell Barr on: 07/19/2020 09:22 AM   Modules accepted: Orders

## 2020-07-19 NOTE — Patient Instructions (Addendum)
Third Trimester of Pregnancy The third trimester is from week 28 through week 40 (months 7 through 9). The third trimester is a time when the unborn baby (fetus) is growing rapidly. At the end of the ninth month, the fetus is about 20 inches in length and weighs 6-10 pounds. Body changes during your third trimester Your body will continue to go through many changes during pregnancy. The changes vary from woman to woman. During the third trimester:  Your weight will continue to increase. You can expect to gain 25-35 pounds (11-16 kg) by the end of the pregnancy.  You may begin to get stretch marks on your hips, abdomen, and breasts.  You may urinate more often because the fetus is moving lower into your pelvis and pressing on your bladder.  You may develop or continue to have heartburn. This is caused by increased hormones that slow down muscles in the digestive tract.  You may develop or continue to have constipation because increased hormones slow digestion and cause the muscles that push waste through your intestines to relax.  You may develop hemorrhoids. These are swollen veins (varicose veins) in the rectum that can itch or be painful.  You may develop swollen, bulging veins (varicose veins) in your legs.  You may have increased body aches in the pelvis, back, or thighs. This is due to weight gain and increased hormones that are relaxing your joints.  You may have changes in your hair. These can include thickening of your hair, rapid growth, and changes in texture. Some women also have hair loss during or after pregnancy, or hair that feels dry or thin. Your hair will most likely return to normal after your baby is born.  Your breasts will continue to grow and they will continue to become tender. A yellow fluid (colostrum) may leak from your breasts. This is the first milk you are producing for your baby.  Your belly button may stick out.  You may notice more swelling in your hands,  face, or ankles.  You may have increased tingling or numbness in your hands, arms, and legs. The skin on your belly may also feel numb.  You may feel short of breath because of your expanding uterus.  You may have more problems sleeping. This can be caused by the size of your belly, increased need to urinate, and an increase in your body's metabolism.  You may notice the fetus "dropping," or moving lower in your abdomen (lightening).  You may have increased vaginal discharge.  You may notice your joints feel loose and you may have pain around your pelvic bone. What to expect at prenatal visits You will have prenatal exams every 2 weeks until week 36. Then you will have weekly prenatal exams. During a routine prenatal visit:  You will be weighed to make sure you and the baby are growing normally.  Your blood pressure will be taken.  Your abdomen will be measured to track your baby's growth.  The fetal heartbeat will be listened to.  Any test results from the previous visit will be discussed.  You may have a cervical check near your due date to see if your cervix has softened or thinned (effaced).  You will be tested for Group B streptococcus. This happens between 35 and 37 weeks. Your health care provider may ask you:  What your birth plan is.  How you are feeling.  If you are feeling the baby move.  If you have had any abnormal   symptoms, such as leaking fluid, bleeding, severe headaches, or abdominal cramping.  If you are using any tobacco products, including cigarettes, chewing tobacco, and electronic cigarettes.  If you have any questions. Other tests or screenings that may be performed during your third trimester include:  Blood tests that check for low iron levels (anemia).  Fetal testing to check the health, activity level, and growth of the fetus. Testing is done if you have certain medical conditions or if there are problems during the pregnancy.  Nonstress test  (NST). This test checks the health of your baby to make sure there are no signs of problems, such as the baby not getting enough oxygen. During this test, a belt is placed around your belly. The baby is made to move, and its heart rate is monitored during movement. What is false labor? False labor is a condition in which you feel small, irregular tightenings of the muscles in the womb (contractions) that usually go away with rest, changing position, or drinking water. These are called Braxton Hicks contractions. Contractions may last for hours, days, or even weeks before true labor sets in. If contractions come at regular intervals, become more frequent, increase in intensity, or become painful, you should see your health care provider. What are the signs of labor?  Abdominal cramps.  Regular contractions that start at 10 minutes apart and become stronger and more frequent with time.  Contractions that start on the top of the uterus and spread down to the lower abdomen and back.  Increased pelvic pressure and dull back pain.  A watery or bloody mucus discharge that comes from the vagina.  Leaking of amniotic fluid. This is also known as your "water breaking." It could be a slow trickle or a gush. Let your health care provider know if it has a color or strange odor. If you have any of these signs, call your health care provider right away, even if it is before your due date. Follow these instructions at home: Medicines  Follow your health care provider's instructions regarding medicine use. Specific medicines may be either safe or unsafe to take during pregnancy.  Take a prenatal vitamin that contains at least 600 micrograms (mcg) of folic acid.  If you develop constipation, try taking a stool softener if your health care provider approves. Eating and drinking   Eat a balanced diet that includes fresh fruits and vegetables, whole grains, good sources of protein such as meat, eggs, or tofu,  and low-fat dairy. Your health care provider will help you determine the amount of weight gain that is right for you.  Avoid raw meat and uncooked cheese. These carry germs that can cause birth defects in the baby.  If you have low calcium intake from food, talk to your health care provider about whether you should take a daily calcium supplement.  Eat four or five small meals rather than three large meals a day.  Limit foods that are high in fat and processed sugars, such as fried and sweet foods.  To prevent constipation: ? Drink enough fluid to keep your urine clear or pale yellow. ? Eat foods that are high in fiber, such as fresh fruits and vegetables, whole grains, and beans. Activity  Exercise only as directed by your health care provider. Most women can continue their usual exercise routine during pregnancy. Try to exercise for 30 minutes at least 5 days a week. Stop exercising if you experience uterine contractions.  Avoid heavy lifting.  Do   not exercise in extreme heat or humidity, or at high altitudes.  Wear low-heel, comfortable shoes.  Practice good posture.  You may continue to have sex unless your health care provider tells you otherwise. Relieving pain and discomfort  Take frequent breaks and rest with your legs elevated if you have leg cramps or low back pain.  Take warm sitz baths to soothe any pain or discomfort caused by hemorrhoids. Use hemorrhoid cream if your health care provider approves.  Wear a good support bra to prevent discomfort from breast tenderness.  If you develop varicose veins: ? Wear support pantyhose or compression stockings as told by your healthcare provider. ? Elevate your feet for 15 minutes, 3-4 times a day. Prenatal care  Write down your questions. Take them to your prenatal visits.  Keep all your prenatal visits as told by your health care provider. This is important. Safety  Wear your seat belt at all times when driving.  Make  a list of emergency phone numbers, including numbers for family, friends, the hospital, and police and fire departments. General instructions  Avoid cat litter boxes and soil used by cats. These carry germs that can cause birth defects in the baby. If you have a cat, ask someone to clean the litter box for you.  Do not travel far distances unless it is absolutely necessary and only with the approval of your health care provider.  Do not use hot tubs, steam rooms, or saunas.  Do not drink alcohol.  Do not use any products that contain nicotine or tobacco, such as cigarettes and e-cigarettes. If you need help quitting, ask your health care provider.  Do not use any medicinal herbs or unprescribed drugs. These chemicals affect the formation and growth of the baby.  Do not douche or use tampons or scented sanitary pads.  Do not cross your legs for long periods of time.  To prepare for the arrival of your baby: ? Take prenatal classes to understand, practice, and ask questions about labor and delivery. ? Make a trial run to the hospital. ? Visit the hospital and tour the maternity area. ? Arrange for maternity or paternity leave through employers. ? Arrange for family and friends to take care of pets while you are in the hospital. ? Purchase a rear-facing car seat and make sure you know how to install it in your car. ? Pack your hospital bag. ? Prepare the baby's nursery. Make sure to remove all pillows and stuffed animals from the baby's crib to prevent suffocation.  Visit your dentist if you have not gone during your pregnancy. Use a soft toothbrush to brush your teeth and be gentle when you floss. Contact a health care provider if:  You are unsure if you are in labor or if your water has broken.  You become dizzy.  You have mild pelvic cramps, pelvic pressure, or nagging pain in your abdominal area.  You have lower back pain.  You have persistent nausea, vomiting, or  diarrhea.  You have an unusual or bad smelling vaginal discharge.  You have pain when you urinate. Get help right away if:  Your water breaks before 37 weeks.  You have regular contractions less than 5 minutes apart before 37 weeks.  You have a fever.  You are leaking fluid from your vagina.  You have spotting or bleeding from your vagina.  You have severe abdominal pain or cramping.  You have rapid weight loss or weight gain.  You have   shortness of breath with chest pain.  You notice sudden or extreme swelling of your face, hands, ankles, feet, or legs.  Your baby makes fewer than 10 movements in 2 hours.  You have severe headaches that do not go away when you take medicine.  You have vision changes. Summary  The third trimester is from week 28 through week 40, months 7 through 9. The third trimester is a time when the unborn baby (fetus) is growing rapidly.  During the third trimester, your discomfort may increase as you and your baby continue to gain weight. You may have abdominal, leg, and back pain, sleeping problems, and an increased need to urinate.  During the third trimester your breasts will keep growing and they will continue to become tender. A yellow fluid (colostrum) may leak from your breasts. This is the first milk you are producing for your baby.  False labor is a condition in which you feel small, irregular tightenings of the muscles in the womb (contractions) that eventually go away. These are called Braxton Hicks contractions. Contractions may last for hours, days, or even weeks before true labor sets in.  Signs of labor can include: abdominal cramps; regular contractions that start at 10 minutes apart and become stronger and more frequent with time; watery or bloody mucus discharge that comes from the vagina; increased pelvic pressure and dull back pain; and leaking of amniotic fluid. This information is not intended to replace advice given to you by your  health care provider. Make sure you discuss any questions you have with your health care provider. Document Revised: 12/18/2018 Document Reviewed: 10/02/2016 Elsevier Patient Education  2020 [employer]  Sciatica  Sciatica is pain, numbness, weakness, or tingling along the path of the sciatic nerve. The sciatic nerve starts in the lower back and runs down the back of each leg. The nerve controls the muscles in the lower leg and in the back of the knee. It also provides feeling (sensation) to the back of the thigh, the lower leg, and the sole of the foot. Sciatica is a symptom of another medical condition that pinches or puts pressure on the sciatic nerve. Sciatica most often only affects one side of the body. Sciatica usually goes away on its own or with treatment. In some cases, sciatica may come back (recur). What are the causes? This condition is caused by pressure on the sciatic nerve or pinching of the nerve. This may be the result of:  A disk in between the bones of the spine bulging out too far (herniated disk).  Age-related changes in the spinal disks.  A pain disorder that affects a muscle in the buttock.  Extra bone growth near the sciatic nerve.  A break (fracture) of the pelvis.  Pregnancy.  Tumor. This is rare. What increases the risk? The following factors may make you more likely to develop this condition:  Playing sports that place pressure or stress on the spine.  Having poor strength and flexibility.  A history of back injury or surgery.  Sitting for long periods of time.  Doing activities that involve repetitive bending or lifting.  Obesity. What are the signs or symptoms? Symptoms can vary from mild to very severe, and they may include:  Any of these problems in the lower back, leg, hip, or buttock: ? Mild tingling, numbness, or dull aches. ? Burning sensations. ? Sharp pains.  Numbness in the back of the calf or the sole of the foot.  Leg  weakness.  Severe back pain that makes movement difficult. Symptoms may get worse when you cough, sneeze, or laugh, or when you sit or stand for long periods of time. How is this diagnosed? This condition may be diagnosed based on:  Your symptoms and medical history.  A physical exam.  Blood tests.  Imaging tests, such as: ? X-rays. ? MRI. ? CT scan. How is this treated? In many cases, this condition improves on its own without treatment. However, treatment may include:  Reducing or modifying physical activity.  Exercising and stretching.  Icing and applying heat to the affected area.  Medicines that help to: ? Relieve pain and swelling. ? Relax your muscles.  Injections of medicines that help to relieve pain, irritation, and inflammation around the sciatic nerve (steroids).  Surgery. Follow these instructions at home: Medicines  Take over-the-counter and prescription medicines only as told by your health care provider.  Ask your health care provider if the medicine prescribed to you: ? Requires you to avoid driving or using heavy machinery. ? Can cause constipation. You may need to take these actions to prevent or treat constipation:  Drink enough fluid to keep your urine pale yellow.  Take over-the-counter or prescription medicines.  Eat foods that are high in fiber, such as beans, whole grains, and fresh fruits and vegetables.  Limit foods that are high in fat and processed sugars, such as fried or sweet foods. Managing pain      If directed, put ice on the affected area. ? Put ice in a plastic bag. ? Place a towel between your skin and the bag. ? Leave the ice on for 20 minutes, 2-3 times a day.  If directed, apply heat to the affected area. Use the heat source that your health care provider recommends, such as a moist heat pack or a heating pad. ? Place a towel between your skin and the heat source. ? Leave the heat on for 20-30 minutes. ? Remove the  heat if your skin turns bright red. This is especially important if you are unable to feel pain, heat, or cold. You may have a greater risk of getting burned. Activity   Return to your normal activities as told by your health care provider. Ask your health care provider what activities are safe for you.  Avoid activities that make your symptoms worse.  Take brief periods of rest throughout the day. ? When you rest for longer periods, mix in some mild activity or stretching between periods of rest. This will help to prevent stiffness and pain. ? Avoid sitting for long periods of time without moving. Get up and move around at least one time each hour.  Exercise and stretch regularly, as told by your health care provider.  Do not lift anything that is heavier than 10 lb (4.5 kg) while you have symptoms of sciatica. When you do not have symptoms, you should still avoid heavy lifting, especially repetitive heavy lifting.  When you lift objects, always use proper lifting technique, which includes: ? Bending your knees. ? Keeping the load close to your body. ? Avoiding twisting. General instructions  Maintain a healthy weight. Excess weight puts extra stress on your back.  Wear supportive, comfortable shoes. Avoid wearing high heels.  Avoid sleeping on a mattress that is too soft or too hard. A mattress that is firm enough to support your back when you sleep may help to reduce your pain.  Keep all follow-up visits as told by  your health care provider. This is important. Contact a health care provider if:  You have pain that: ? Wakes you up when you are sleeping. ? Gets worse when you lie down. ? Is worse than you have experienced in the past. ? Lasts longer than 4 weeks.  You have an unexplained weight loss. Get help right away if:  You are not able to control when you urinate or have bowel movements (incontinence).  You have: ? Weakness in your lower back, pelvis, buttocks, or  legs that gets worse. ? Redness or swelling of your back. ? A burning sensation when you urinate. Summary  Sciatica is pain, numbness, weakness, or tingling along the path of the sciatic nerve.  This condition is caused by pressure on the sciatic nerve or pinching of the nerve.  Sciatica can cause pain, numbness, or tingling in the lower back, legs, hips, and buttocks.  Treatment often includes rest, exercise, medicines, and applying ice or heat. This information is not intended to replace advice given to you by your health care provider. Make sure you discuss any questions you have with your health care provider. Document Revised: 09/15/2018 Document Reviewed: 09/15/2018 Elsevier Patient Education  2020 [employer].

## 2020-07-20 LAB — CBC
Hematocrit: 32.4 % — ABNORMAL LOW (ref 34.0–46.6)
Hemoglobin: 11.1 g/dL (ref 11.1–15.9)
MCH: 30 pg (ref 26.6–33.0)
MCHC: 34.3 g/dL (ref 31.5–35.7)
MCV: 88 fL (ref 79–97)
Platelets: 240 10*3/uL (ref 150–450)
RBC: 3.7 x10E6/uL — ABNORMAL LOW (ref 3.77–5.28)
RDW: 11.9 % (ref 11.7–15.4)
WBC: 12.3 10*3/uL — ABNORMAL HIGH (ref 3.4–10.8)

## 2020-07-20 LAB — RPR: RPR Ser Ql: NONREACTIVE

## 2020-07-20 LAB — HIV ANTIBODY (ROUTINE TESTING W REFLEX): HIV Screen 4th Generation wRfx: NONREACTIVE

## 2020-07-20 LAB — GLUCOSE TOLERANCE, 2 HOURS W/ 1HR
Glucose, 1 hour: 191 mg/dL — ABNORMAL HIGH (ref 65–179)
Glucose, 2 hour: 129 mg/dL (ref 65–152)
Glucose, Fasting: 77 mg/dL (ref 65–91)

## 2020-07-21 ENCOUNTER — Encounter: Payer: Self-pay | Admitting: Advanced Practice Midwife

## 2020-07-21 DIAGNOSIS — O24419 Gestational diabetes mellitus in pregnancy, unspecified control: Secondary | ICD-10-CM | POA: Insufficient documentation

## 2020-07-22 ENCOUNTER — Other Ambulatory Visit: Payer: Self-pay

## 2020-07-22 ENCOUNTER — Encounter: Payer: BC Managed Care – PPO | Admitting: Family Medicine

## 2020-07-22 DIAGNOSIS — O99810 Abnormal glucose complicating pregnancy: Secondary | ICD-10-CM

## 2020-07-22 NOTE — Progress Notes (Signed)
Patient called and made aware of abnormal 2 hour gtt. Patient made aware that she will attended diabetes management class and they will call her with appointment. Made her aware to return call to office if she has not heard from them by end of week next week- patient states understanding and agrees. Armandina Stammer RN

## 2020-07-25 ENCOUNTER — Telehealth: Payer: Self-pay

## 2020-07-25 NOTE — Telephone Encounter (Signed)
Pt called wanting to confirm her appt.    Emerie Vanderkolk,RN  07/25/20

## 2020-07-27 ENCOUNTER — Other Ambulatory Visit: Payer: Self-pay

## 2020-07-27 ENCOUNTER — Encounter: Payer: Self-pay | Admitting: Registered"

## 2020-07-27 ENCOUNTER — Encounter: Payer: BC Managed Care – PPO | Attending: Advanced Practice Midwife | Admitting: Registered"

## 2020-07-27 DIAGNOSIS — O24419 Gestational diabetes mellitus in pregnancy, unspecified control: Secondary | ICD-10-CM | POA: Insufficient documentation

## 2020-07-27 NOTE — Progress Notes (Signed)
Patient was seen on 07/27/20 for Gestational Diabetes self-management class at [facility]. The following learning objectives were met by the patient during this course: ° °· States the definition of Gestational Diabetes °· States why dietary management is important in controlling blood glucose °· Describes the effects each nutrient has on blood glucose levels °· Demonstrates ability to create a balanced meal plan °· Demonstrates carbohydrate counting  °· States when to check blood glucose levels °· Demonstrates proper blood glucose monitoring techniques °· States the effect of stress and exercise on blood glucose levels °· States the importance of limiting caffeine and abstaining from alcohol and smoking ° °Blood glucose monitor given: OneTouch Verio Flex °Lot# [id number] °Exp: 04/09/2021 °Blood Glucose: 83 mg/dL ° °Patient instructed to monitor glucose levels: °FBS: 60 - <95; 1 hour: <140; 2 hour: <120 ° °Patient received handouts: °· Nutrition Diabetes and Pregnancy, including carb counting list ° °Patient will be seen for follow-up as needed. °

## 2020-07-28 ENCOUNTER — Other Ambulatory Visit: Payer: Self-pay

## 2020-07-28 MED ORDER — ONETOUCH ULTRASOFT LANCETS MISC: 12 refills | Status: DC

## 2020-07-28 MED ORDER — GLUCOSE BLOOD VI STRP: ORAL_STRIP | 12 refills | Status: DC

## 2020-07-28 MED FILL — ONE TOUCH ULTRA TEST STRIPS: 30 days supply | Qty: 100 | Fill #0

## 2020-07-28 MED FILL — ONE TOUCH ULTRASOFT LANCETS: 30 days supply | Qty: 100 | Fill #0

## 2020-07-28 NOTE — Progress Notes (Signed)
Patient needs diabetic supplies. Armandina Stammer RN

## 2020-08-02 ENCOUNTER — Other Ambulatory Visit: Payer: Self-pay

## 2020-08-02 ENCOUNTER — Ambulatory Visit (INDEPENDENT_AMBULATORY_CARE_PROVIDER_SITE_OTHER): Payer: BC Managed Care – PPO | Admitting: Advanced Practice Midwife

## 2020-08-02 ENCOUNTER — Other Ambulatory Visit: Payer: Self-pay | Admitting: Advanced Practice Midwife

## 2020-08-02 ENCOUNTER — Encounter: Payer: Self-pay | Admitting: Advanced Practice Midwife

## 2020-08-02 VITALS — BP 109/67 | HR 102 | Wt 142.0 lb

## 2020-08-02 DIAGNOSIS — Z3A31 31 weeks gestation of pregnancy: Secondary | ICD-10-CM

## 2020-08-02 DIAGNOSIS — O99810 Abnormal glucose complicating pregnancy: Secondary | ICD-10-CM

## 2020-08-02 DIAGNOSIS — O24419 Gestational diabetes mellitus in pregnancy, unspecified control: Secondary | ICD-10-CM

## 2020-08-02 MED ORDER — ONETOUCH ULTRASOFT LANCETS MISC: 12 refills | Status: DC

## 2020-08-02 MED ORDER — ASPIRIN 81 MG PO CHEW: 81.0000 mg | CHEWABLE_TABLET | Freq: Every day | ORAL | 3 refills | Status: DC

## 2020-08-02 MED ORDER — GLUCOSE BLOOD VI STRP: 1.0000 | ORAL_STRIP | Freq: Four times a day (QID) | 12 refills | Status: DC

## 2020-08-02 MED FILL — ONE TOUCH ULTRASOFT LANCETS: 25 days supply | Qty: 100 | Fill #0

## 2020-08-02 MED FILL — ONE TOUCH ULTRA TEST STRIPS: 75 days supply | Qty: 300 | Fill #0

## 2020-08-02 MED FILL — ASPIRIN CHILD 81 MG TAB CHE: 81 | 36 days supply | Qty: 36 | Fill #0

## 2020-08-02 NOTE — Progress Notes (Signed)
° °  PRENATAL VISIT NOTE  Subjective:  Patricia Delgado is a 29 y.o. G1P0 at [redacted]w[redacted]d being seen today for ongoing prenatal care.  She is currently monitored for the following issues for this high-risk pregnancy and has Supervision of normal first pregnancy; Abnormal fetal ultrasound; and Gestational diabetes on their problem list.  Patient reports no complaints and needs Diabetes suppies reordered.  Contractions: Not present. Vag. Bleeding: None.  Movement: Present. Denies leaking of fluid.   The following portions of the patient's history were reviewed and updated as appropriate: allergies, current medications, past family history, past medical history, past social history, past surgical history and problem list.   Objective:   Vitals:   08/02/20 0854  BP: 109/67  Pulse: (!) 102  Weight: 142 lb (64.4 kg)    Fetal Status:     Movement: Present     General:  Alert, oriented and cooperative. Patient is in no acute distress.  Skin: Skin is warm and dry. No rash noted.   Cardiovascular: Normal heart rate noted  Respiratory: Normal respiratory effort, no problems with respiration noted  Abdomen: Soft, gravid, appropriate for gestational age.  Pain/Pressure: Present     Pelvic: Cervical exam deferred        Extremities: Normal range of motion.  Edema: Trace  Mental Status: Normal mood and affect. Normal behavior. Normal judgment and thought content.   Assessment and Plan:  Pregnancy: G1P0 at [redacted]w[redacted]d 1. Abnormal glucose tolerance test (GTT) during pregnancy, antepartum     All values WNL so far     SUpplies reordered  - [place] [facility] FOLLOW UP; Future  2. [redacted] weeks gestation of pregnancy  - [place] [facility] FOLLOW UP; Future  3. Gestational diabetes mellitus (GDM), antepartum, gestational diabetes method of control unspecified      - [place] [facility] FOLLOW UP; Future  Preterm labor symptoms and general obstetric precautions including but not limited to vaginal bleeding, contractions, leaking of fluid and  fetal movement were reviewed in detail with the patient. Please refer to After Visit Summary for other counseling recommendations.   Return in about 2 weeks (around 08/16/2020) for [facility].  Future Appointments  Date Time Provider Department Center  08/08/2020  1:30 PM [facility] NURSE [facility]  08/08/2020  1:45 PM [facility] US[id number]  08/18/2020  8:30 AM [place], Rhona Raider, DO [id number] None    Wynelle Bourgeois, CNM

## 2020-08-02 NOTE — Patient Instructions (Signed)
Third Trimester of Pregnancy The third trimester is from week 28 through week 40 (months 7 through 9). The third trimester is a time when the unborn baby (fetus) is growing rapidly. At the end of the ninth month, the fetus is about 20 inches in length and weighs 6-10 pounds. Body changes during your third trimester Your body will continue to go through many changes during pregnancy. The changes vary from woman to woman. During the third trimester:  Your weight will continue to increase. You can expect to gain 25-35 pounds (11-16 kg) by the end of the pregnancy.  You may begin to get stretch marks on your hips, abdomen, and breasts.  You may urinate more often because the fetus is moving lower into your pelvis and pressing on your bladder.  You may develop or continue to have heartburn. This is caused by increased hormones that slow down muscles in the digestive tract.  You may develop or continue to have constipation because increased hormones slow digestion and cause the muscles that push waste through your intestines to relax.  You may develop hemorrhoids. These are swollen veins (varicose veins) in the rectum that can itch or be painful.  You may develop swollen, bulging veins (varicose veins) in your legs.  You may have increased body aches in the pelvis, back, or thighs. This is due to weight gain and increased hormones that are relaxing your joints.  You may have changes in your hair. These can include thickening of your hair, rapid growth, and changes in texture. Some women also have hair loss during or after pregnancy, or hair that feels dry or thin. Your hair will most likely return to normal after your baby is born.  Your breasts will continue to grow and they will continue to become tender. A yellow fluid (colostrum) may leak from your breasts. This is the first milk you are producing for your baby.  Your belly button may stick out.  You may notice more swelling in your hands,  face, or ankles.  You may have increased tingling or numbness in your hands, arms, and legs. The skin on your belly may also feel numb.  You may feel short of breath because of your expanding uterus.  You may have more problems sleeping. This can be caused by the size of your belly, increased need to urinate, and an increase in your body's metabolism.  You may notice the fetus "dropping," or moving lower in your abdomen (lightening).  You may have increased vaginal discharge.  You may notice your joints feel loose and you may have pain around your pelvic bone. What to expect at prenatal visits You will have prenatal exams every 2 weeks until week 36. Then you will have weekly prenatal exams. During a routine prenatal visit:  You will be weighed to make sure you and the baby are growing normally.  Your blood pressure will be taken.  Your abdomen will be measured to track your baby's growth.  The fetal heartbeat will be listened to.  Any test results from the previous visit will be discussed.  You may have a cervical check near your due date to see if your cervix has softened or thinned (effaced).  You will be tested for Group B streptococcus. This happens between 35 and 37 weeks. Your health care provider may ask you:  What your birth plan is.  How you are feeling.  If you are feeling the baby move.  If you have had any abnormal   symptoms, such as leaking fluid, bleeding, severe headaches, or abdominal cramping.  If you are using any tobacco products, including cigarettes, chewing tobacco, and electronic cigarettes.  If you have any questions. Other tests or screenings that may be performed during your third trimester include:  Blood tests that check for low iron levels (anemia).  Fetal testing to check the health, activity level, and growth of the fetus. Testing is done if you have certain medical conditions or if there are problems during the pregnancy.  Nonstress test  (NST). This test checks the health of your baby to make sure there are no signs of problems, such as the baby not getting enough oxygen. During this test, a belt is placed around your belly. The baby is made to move, and its heart rate is monitored during movement. What is false labor? False labor is a condition in which you feel small, irregular tightenings of the muscles in the womb (contractions) that usually go away with rest, changing position, or drinking water. These are called Braxton Hicks contractions. Contractions may last for hours, days, or even weeks before true labor sets in. If contractions come at regular intervals, become more frequent, increase in intensity, or become painful, you should see your health care provider. What are the signs of labor?  Abdominal cramps.  Regular contractions that start at 10 minutes apart and become stronger and more frequent with time.  Contractions that start on the top of the uterus and spread down to the lower abdomen and back.  Increased pelvic pressure and dull back pain.  A watery or bloody mucus discharge that comes from the vagina.  Leaking of amniotic fluid. This is also known as your "water breaking." It could be a slow trickle or a gush. Let your health care provider know if it has a color or strange odor. If you have any of these signs, call your health care provider right away, even if it is before your due date. Follow these instructions at home: Medicines  Follow your health care provider's instructions regarding medicine use. Specific medicines may be either safe or unsafe to take during pregnancy.  Take a prenatal vitamin that contains at least 600 micrograms (mcg) of folic acid.  If you develop constipation, try taking a stool softener if your health care provider approves. Eating and drinking   Eat a balanced diet that includes fresh fruits and vegetables, whole grains, good sources of protein such as meat, eggs, or tofu,  and low-fat dairy. Your health care provider will help you determine the amount of weight gain that is right for you.  Avoid raw meat and uncooked cheese. These carry germs that can cause birth defects in the baby.  If you have low calcium intake from food, talk to your health care provider about whether you should take a daily calcium supplement.  Eat four or five small meals rather than three large meals a day.  Limit foods that are high in fat and processed sugars, such as fried and sweet foods.  To prevent constipation: ? Drink enough fluid to keep your urine clear or pale yellow. ? Eat foods that are high in fiber, such as fresh fruits and vegetables, whole grains, and beans. Activity  Exercise only as directed by your health care provider. Most women can continue their usual exercise routine during pregnancy. Try to exercise for 30 minutes at least 5 days a week. Stop exercising if you experience uterine contractions.  Avoid heavy lifting.  Do   not exercise in extreme heat or humidity, or at high altitudes.  Wear low-heel, comfortable shoes.  Practice good posture.  You may continue to have sex unless your health care provider tells you otherwise. Relieving pain and discomfort  Take frequent breaks and rest with your legs elevated if you have leg cramps or low back pain.  Take warm sitz baths to soothe any pain or discomfort caused by hemorrhoids. Use hemorrhoid cream if your health care provider approves.  Wear a good support bra to prevent discomfort from breast tenderness.  If you develop varicose veins: ? Wear support pantyhose or compression stockings as told by your healthcare provider. ? Elevate your feet for 15 minutes, 3-4 times a day. Prenatal care  Write down your questions. Take them to your prenatal visits.  Keep all your prenatal visits as told by your health care provider. This is important. Safety  Wear your seat belt at all times when driving.  Make  a list of emergency phone numbers, including numbers for family, friends, the hospital, and police and fire departments. General instructions  Avoid cat litter boxes and soil used by cats. These carry germs that can cause birth defects in the baby. If you have a cat, ask someone to clean the litter box for you.  Do not travel far distances unless it is absolutely necessary and only with the approval of your health care provider.  Do not use hot tubs, steam rooms, or saunas.  Do not drink alcohol.  Do not use any products that contain nicotine or tobacco, such as cigarettes and e-cigarettes. If you need help quitting, ask your health care provider.  Do not use any medicinal herbs or unprescribed drugs. These chemicals affect the formation and growth of the baby.  Do not douche or use tampons or scented sanitary pads.  Do not cross your legs for long periods of time.  To prepare for the arrival of your baby: ? Take prenatal classes to understand, practice, and ask questions about labor and delivery. ? Make a trial run to the hospital. ? Visit the hospital and tour the maternity area. ? Arrange for maternity or paternity leave through employers. ? Arrange for family and friends to take care of pets while you are in the hospital. ? Purchase a rear-facing car seat and make sure you know how to install it in your car. ? Pack your hospital bag. ? Prepare the baby's nursery. Make sure to remove all pillows and stuffed animals from the baby's crib to prevent suffocation.  Visit your dentist if you have not gone during your pregnancy. Use a soft toothbrush to brush your teeth and be gentle when you floss. Contact a health care provider if:  You are unsure if you are in labor or if your water has broken.  You become dizzy.  You have mild pelvic cramps, pelvic pressure, or nagging pain in your abdominal area.  You have lower back pain.  You have persistent nausea, vomiting, or  diarrhea.  You have an unusual or bad smelling vaginal discharge.  You have pain when you urinate. Get help right away if:  Your water breaks before 37 weeks.  You have regular contractions less than 5 minutes apart before 37 weeks.  You have a fever.  You are leaking fluid from your vagina.  You have spotting or bleeding from your vagina.  You have severe abdominal pain or cramping.  You have rapid weight loss or weight gain.  You have   shortness of breath with chest pain.  You notice sudden or extreme swelling of your face, hands, ankles, feet, or legs.  Your baby makes fewer than 10 movements in 2 hours.  You have severe headaches that do not go away when you take medicine.  You have vision changes. Summary  The third trimester is from week 28 through week 40, months 7 through 9. The third trimester is a time when the unborn baby (fetus) is growing rapidly.  During the third trimester, your discomfort may increase as you and your baby continue to gain weight. You may have abdominal, leg, and back pain, sleeping problems, and an increased need to urinate.  During the third trimester your breasts will keep growing and they will continue to become tender. A yellow fluid (colostrum) may leak from your breasts. This is the first milk you are producing for your baby.  False labor is a condition in which you feel small, irregular tightenings of the muscles in the womb (contractions) that eventually go away. These are called Braxton Hicks contractions. Contractions may last for hours, days, or even weeks before true labor sets in.  Signs of labor can include: abdominal cramps; regular contractions that start at 10 minutes apart and become stronger and more frequent with time; watery or bloody mucus discharge that comes from the vagina; increased pelvic pressure and dull back pain; and leaking of amniotic fluid. This information is not intended to replace advice given to you by your  health care provider. Make sure you discuss any questions you have with your health care provider. Document Revised: 12/18/2018 Document Reviewed: 10/02/2016 Elsevier Patient Education  2020 [employer]  

## 2020-08-03 ENCOUNTER — Other Ambulatory Visit: Payer: Self-pay

## 2020-08-03 MED FILL — ONETOUCH ULTRA 2 W/DEVICE K: W/DEVICE | 30 days supply | Qty: 1 | Fill #0

## 2020-08-03 NOTE — Progress Notes (Signed)
Pt called requesting a Rx for One touch verio flex lancets and test strips. Verbal order for Verio lancets and testing strips was given to Phil at [facility]. Sheela Mcculley l Elroy Schembri, CMA

## 2020-08-08 ENCOUNTER — Ambulatory Visit: Payer: BC Managed Care – PPO | Attending: Advanced Practice Midwife

## 2020-08-08 ENCOUNTER — Encounter: Payer: Self-pay | Admitting: *Deleted

## 2020-08-08 ENCOUNTER — Ambulatory Visit: Payer: BC Managed Care – PPO | Admitting: *Deleted

## 2020-08-08 ENCOUNTER — Other Ambulatory Visit: Payer: Self-pay | Admitting: *Deleted

## 2020-08-08 ENCOUNTER — Other Ambulatory Visit: Payer: Self-pay

## 2020-08-08 VITALS — BP 106/67 | HR 91

## 2020-08-08 DIAGNOSIS — O99810 Abnormal glucose complicating pregnancy: Secondary | ICD-10-CM | POA: Diagnosis not present

## 2020-08-08 DIAGNOSIS — O24419 Gestational diabetes mellitus in pregnancy, unspecified control: Secondary | ICD-10-CM | POA: Diagnosis not present

## 2020-08-08 DIAGNOSIS — O24410 Gestational diabetes mellitus in pregnancy, diet controlled: Secondary | ICD-10-CM

## 2020-08-08 DIAGNOSIS — Z3A31 31 weeks gestation of pregnancy: Secondary | ICD-10-CM | POA: Diagnosis not present

## 2020-08-18 ENCOUNTER — Other Ambulatory Visit: Payer: Self-pay

## 2020-08-18 ENCOUNTER — Ambulatory Visit (INDEPENDENT_AMBULATORY_CARE_PROVIDER_SITE_OTHER): Payer: BC Managed Care – PPO | Admitting: Family Medicine

## 2020-08-18 VITALS — BP 99/63 | HR 111 | Wt 144.0 lb

## 2020-08-18 DIAGNOSIS — Z3A33 33 weeks gestation of pregnancy: Secondary | ICD-10-CM

## 2020-08-18 DIAGNOSIS — O283 Abnormal ultrasonic finding on antenatal screening of mother: Secondary | ICD-10-CM

## 2020-08-18 DIAGNOSIS — Z3403 Encounter for supervision of normal first pregnancy, third trimester: Secondary | ICD-10-CM

## 2020-08-18 DIAGNOSIS — O24419 Gestational diabetes mellitus in pregnancy, unspecified control: Secondary | ICD-10-CM

## 2020-08-18 NOTE — Progress Notes (Signed)
   PRENATAL VISIT NOTE  Subjective:  Patricia Delgado is a 29 y.o. G1P0 at [redacted]w[redacted]d being seen today for ongoing prenatal care.  She is currently monitored for the following issues for this high-risk pregnancy and has Supervision of normal first pregnancy; Abnormal fetal ultrasound; and Gestational diabetes on their problem list.  Patient reports occasional contractions.  Contractions: Not present. Vag. Bleeding: None.  Movement: Present. Denies leaking of fluid.   The following portions of the patient's history were reviewed and updated as appropriate: allergies, current medications, past family history, past medical history, past social history, past surgical history and problem list.   Objective:   Vitals:   08/18/20 0836  BP: 99/63  Pulse: (!) 111  Weight: 144 lb (65.3 kg)    Fetal Status: Fetal Heart Rate (bpm): 149 Fundal Height: 33 cm Movement: Present     General:  Alert, oriented and cooperative. Patient is in no acute distress.  Skin: Skin is warm and dry. No rash noted.   Cardiovascular: Normal heart rate noted  Respiratory: Normal respiratory effort, no problems with respiration noted  Abdomen: Soft, gravid, appropriate for gestational age.  Pain/Pressure: Absent     Pelvic: Cervical exam deferred        Extremities: Normal range of motion.  Edema: None  Mental Status: Normal mood and affect. Normal behavior. Normal judgment and thought content.   Assessment and Plan:  Pregnancy: G1P0 at [redacted]w[redacted]d 1. [redacted] weeks gestation of pregnancy  2. Encounter for supervision of normal first pregnancy in third trimester FHT and FH normal Considering POP for contraception  3. Gestational diabetes mellitus (GDM), antepartum, gestational diabetes method of control unspecified Diet Controlled - 2-3 elevated after eating, but mostly within range EFW 93%  4. Abnormal fetal ultrasound LV EIF  Preterm labor symptoms and general obstetric precautions including but not limited to vaginal  bleeding, contractions, leaking of fluid and fetal movement were reviewed in detail with the patient. Please refer to After Visit Summary for other counseling recommendations.   Return in about 2 weeks (around 09/01/2020) for OB f/u.  Future Appointments  Date Time Provider Department Center  09/01/2020  9:45 AM Levie Heritage, DO [facility] None  09/07/2020 10:00 AM [facility] NURSE [facility]  09/07/2020 10:15 AM [facility] US2 [facility]    Levie Heritage, DO

## 2020-08-23 ENCOUNTER — Telehealth: Payer: Self-pay

## 2020-08-23 NOTE — Telephone Encounter (Signed)
Pt states she is having pain in both hands. Explained to pt that carpel tunnel can cause pain in her hands during pregnancy. Pt states that her BP was 105/71. Pt also concerned because she has felt baby move all day but baby doesn't  move much at night. Advised pt to continue to continue to increase water daily,count baby's kicks, and to monitor hands and feet for swelling. Understanding was voiced. Stephanny Tsutsui l Mathilde Mcwherter, CMA

## 2020-09-01 ENCOUNTER — Encounter: Payer: BC Managed Care – PPO | Admitting: Family Medicine

## 2020-09-07 ENCOUNTER — Ambulatory Visit (INDEPENDENT_AMBULATORY_CARE_PROVIDER_SITE_OTHER): Payer: BC Managed Care – PPO | Admitting: Family Medicine

## 2020-09-07 ENCOUNTER — Other Ambulatory Visit: Payer: Self-pay

## 2020-09-07 ENCOUNTER — Encounter: Payer: Self-pay | Admitting: *Deleted

## 2020-09-07 ENCOUNTER — Ambulatory Visit: Payer: BC Managed Care – PPO | Admitting: *Deleted

## 2020-09-07 ENCOUNTER — Other Ambulatory Visit (HOSPITAL_COMMUNITY)
Admission: RE | Admit: 2020-09-07 | Discharge: 2020-09-07 | Disposition: A | Discharge status: Home / Self Care | Payer: BC Managed Care – PPO | Source: Ambulatory Visit | Attending: Family Medicine | Admitting: Family Medicine

## 2020-09-07 ENCOUNTER — Ambulatory Visit (HOSPITAL_BASED_OUTPATIENT_CLINIC_OR_DEPARTMENT_OTHER): Payer: BC Managed Care – PPO

## 2020-09-07 VITALS — BP 115/71 | HR 85

## 2020-09-07 VITALS — BP 108/73 | HR 90 | Wt 149.0 lb

## 2020-09-07 DIAGNOSIS — Z3403 Encounter for supervision of normal first pregnancy, third trimester: Secondary | ICD-10-CM | POA: Diagnosis not present

## 2020-09-07 DIAGNOSIS — O24419 Gestational diabetes mellitus in pregnancy, unspecified control: Secondary | ICD-10-CM

## 2020-09-07 DIAGNOSIS — Z3A36 36 weeks gestation of pregnancy: Secondary | ICD-10-CM | POA: Diagnosis not present

## 2020-09-07 DIAGNOSIS — O24410 Gestational diabetes mellitus in pregnancy, diet controlled: Secondary | ICD-10-CM

## 2020-09-07 DIAGNOSIS — Z362 Encounter for other antenatal screening follow-up: Secondary | ICD-10-CM | POA: Diagnosis not present

## 2020-09-07 DIAGNOSIS — O358XX0 Maternal care for other (suspected) fetal abnormality and damage, not applicable or unspecified: Secondary | ICD-10-CM | POA: Diagnosis not present

## 2020-09-07 DIAGNOSIS — O283 Abnormal ultrasonic finding on antenatal screening of mother: Secondary | ICD-10-CM

## 2020-09-07 NOTE — Progress Notes (Signed)
   PRENATAL VISIT NOTE  Subjective:  Patricia Delgado is a 29 y.o. G1P0 at [redacted]w[redacted]d being seen today for ongoing prenatal care.  She is currently monitored for the following issues for this high-risk pregnancy and has Supervision of normal first pregnancy; Abnormal fetal ultrasound; and Gestational diabetes on their problem list.  Patient reports occasional contractions.  Contractions: Irregular. Vag. Bleeding: None.  Movement: Present. Denies leaking of fluid.   The following portions of the patient's history were reviewed and updated as appropriate: allergies, current medications, past family history, past medical history, past social history, past surgical history and problem list.   Objective:   Vitals:   09/07/20 0819  BP: 108/73  Pulse: 90  Weight: 149 lb (67.6 kg)    Fetal Status: Fetal Heart Rate (bpm): 146 Fundal Height: 36 cm Movement: Present  Presentation: Vertex  General:  Alert, oriented and cooperative. Patient is in no acute distress.  Skin: Skin is warm and dry. No rash noted.   Cardiovascular: Normal heart rate noted  Respiratory: Normal respiratory effort, no problems with respiration noted  Abdomen: Soft, gravid, appropriate for gestational age.  Pain/Pressure: Present     Pelvic: Cervical exam performed in the presence of a chaperone Dilation: 1 Effacement (%): 50 Station: -2  Extremities: Normal range of motion.  Edema: Trace  Mental Status: Normal mood and affect. Normal behavior. Normal judgment and thought content.   Assessment and Plan:  Pregnancy: G1P0 at [redacted]w[redacted]d 1. [redacted] weeks gestation of pregnancy - Culture, beta strep (group b only) - GC/Chlamydia probe amp ([place])not at [facility]  2. Encounter for supervision of normal first pregnancy in third trimester FHT and FH normal - Culture, beta strep (group b only) - GC/Chlamydia probe amp ([place])not at [facility]  3. Gestational diabetes mellitus (GDM), antepartum, gestational diabetes method of control  unspecified Controlled Growth [place] today Induce at 40 weeks.  4. Abnormal fetal ultrasound LV EIF  Preterm labor symptoms and general obstetric precautions including but not limited to vaginal bleeding, contractions, leaking of fluid and fetal movement were reviewed in detail with the patient. Please refer to After Visit Summary for other counseling recommendations.   Return in about 1 week (around 09/14/2020) for OB f/u.  Future Appointments  Date Time Provider Department Center  09/07/2020 10:00 AM [facility] NURSE [facility]  09/07/2020 10:15 AM [facility] US2 [facility]  09/13/2020 10:10 AM Aviva Signs, CNM [facility] None    Levie Heritage, DO

## 2020-09-08 LAB — GC/CHLAMYDIA PROBE AMP (~~LOC~~) NOT AT ARMC
Chlamydia: NEGATIVE
Comment: NEGATIVE
Comment: NORMAL
Neisseria Gonorrhea: NEGATIVE

## 2020-09-11 LAB — CULTURE, BETA STREP (GROUP B ONLY): Strep Gp B Culture: NEGATIVE

## 2020-09-13 ENCOUNTER — Inpatient Hospital Stay (HOSPITAL_COMMUNITY)
Admission: AD | Admit: 2020-09-13 | Discharge: 2020-09-13 | Disposition: A | Discharge status: Home / Self Care | Payer: BC Managed Care – PPO | Attending: Obstetrics and Gynecology | Admitting: Obstetrics and Gynecology

## 2020-09-13 ENCOUNTER — Encounter: Payer: Self-pay | Admitting: Nurse Practitioner

## 2020-09-13 ENCOUNTER — Encounter (HOSPITAL_COMMUNITY): Payer: Self-pay | Admitting: Obstetrics and Gynecology

## 2020-09-13 ENCOUNTER — Other Ambulatory Visit: Payer: Self-pay

## 2020-09-13 ENCOUNTER — Ambulatory Visit (INDEPENDENT_AMBULATORY_CARE_PROVIDER_SITE_OTHER): Payer: BC Managed Care – PPO | Admitting: Nurse Practitioner

## 2020-09-13 VITALS — BP 116/70 | HR 95 | Wt 152.0 lb

## 2020-09-13 DIAGNOSIS — Z0371 Encounter for suspected problem with amniotic cavity and membrane ruled out: Secondary | ICD-10-CM | POA: Diagnosis not present

## 2020-09-13 DIAGNOSIS — Z3403 Encounter for supervision of normal first pregnancy, third trimester: Secondary | ICD-10-CM

## 2020-09-13 DIAGNOSIS — Z3A37 37 weeks gestation of pregnancy: Secondary | ICD-10-CM

## 2020-09-13 DIAGNOSIS — N898 Other specified noninflammatory disorders of vagina: Secondary | ICD-10-CM

## 2020-09-13 DIAGNOSIS — O24419 Gestational diabetes mellitus in pregnancy, unspecified control: Secondary | ICD-10-CM

## 2020-09-13 DIAGNOSIS — O26893 Other specified pregnancy related conditions, third trimester: Secondary | ICD-10-CM

## 2020-09-13 NOTE — MAU Note (Signed)
PT SAYS THOUGHT SROM AT 8- OR VAG D/C- HAD A GUSH AFTER URINATING - NO FLUID SINCE . NO UC'S.  PNC WITH  DR Adrian Blackwater. SEEN TODAY - VE LAST WEEK 1 CM.  DENIES HSV AND MRSA.  GBS- NEG

## 2020-09-13 NOTE — MAU Provider Note (Signed)
Event Date/Time   First Provider Initiated Contact with Patient 09/13/20 2308       S: Patricia Delgado is a 30 y.o. G1P0 at [redacted]w[redacted]d  who presents to MAU today complaining of leaking of fluid since  She denies vaginal bleeding. She endorses contractions. She reports normal fetal movement.    O: BP 115/76 (BP Location: Right Arm)   Pulse 81   Temp 98.2 F (36.8 C) (Oral)   Resp 18   Ht 4\' 11"  (1.499 m)   Wt 71 kg   LMP 12/28/2019   BMI 31.61 kg/m  GENERAL: Well-developed, well-nourished female in no acute distress.  HEAD: Normocephalic, atraumatic.  CHEST: Normal effort of breathing, regular heart rate ABDOMEN: Soft, nontender, gravid PELVIC: Normal external female genitalia. Vagina is pink and rugated. Cervix with normal contour, no lesions. Normal discharge.  Netgative pooling.   Cervical exam:  Dilation: Fingertip Exam by:: KATHERINE, CNM   Fetal Monitoring: Baseline: 130 Variability: mod Accelerations: present Decelerations: absent Contractions: q 6 min  No results found for this or any previous visit (from the past 24 hour(s)). [id number] is negative   A: SIUP at [redacted]w[redacted]d  Membranes intact  P: Discharge home with labor precautions; keep appt next week. Return to MAU if any bleeding, increased contractions or LOF.   [redacted]w[redacted]d, CNM 09/13/2020 11:22 PM

## 2020-09-13 NOTE — Progress Notes (Signed)
    Subjective:  Patricia Delgado is a 30 y.o. G1P0 at [redacted]w[redacted]d being seen today for ongoing prenatal care.  She is currently monitored for the following issues for this high-risk pregnancy and has Supervision of normal first pregnancy; Abnormal fetal ultrasound; and Gestational diabetes on their problem list.  Patient reports occasional contractions.  Contractions: Irritability. Vag. Bleeding: None.  Movement: Present. Denies leaking of fluid.   The following portions of the patient's history were reviewed and updated as appropriate: allergies, current medications, past family history, past medical history, past social history, past surgical history and problem list. Problem list updated.  Objective:   Vitals:   09/13/20 1021  BP: 116/70  Pulse: 95  Weight: 152 lb (68.9 kg)    Fetal Status: Fetal Heart Rate (bpm): 140 Fundal Height: 38 cm Movement: Present     General:  Alert, oriented and cooperative. Patient is in no acute distress.  Skin: Skin is warm and dry. No rash noted.   Cardiovascular: Normal heart rate noted  Respiratory: Normal respiratory effort, no problems with respiration noted  Abdomen: Soft, gravid, appropriate for gestational age. Pain/Pressure: Absent     Pelvic:  Cervical exam deferred        Extremities: Normal range of motion.  Edema: Trace  Mental Status: Normal mood and affect. Normal behavior. Normal judgment and thought content.  Edema in left ankle only  Urinalysis:      Assessment and Plan:  Pregnancy: G1P0 at [redacted]w[redacted]d  1. Encounter for supervision of normal first pregnancy in third trimester Selected pediatrician  2. Gestational diabetes mellitus (GDM), antepartum, gestational diabetes method of control unspecified Did not bring readings to the visit today but states they are all in normal range. Advised to bring to next visit.   Term labor symptoms and general obstetric precautions including but not limited to vaginal bleeding, contractions, leaking of  fluid and fetal movement were reviewed in detail with the patient. Please refer to After Visit Summary for other counseling recommendations.  Return in about 1 week (around 09/20/2020) for in person ROB.  Nolene Bernheim, RN, MSN, NP-BC Nurse Practitioner, [facility] for [employer], [facility] 09/13/2020 10:34 AM

## 2020-09-13 NOTE — Patient Instructions (Signed)

## 2020-09-22 ENCOUNTER — Ambulatory Visit (INDEPENDENT_AMBULATORY_CARE_PROVIDER_SITE_OTHER): Payer: BC Managed Care – PPO | Admitting: Family Medicine

## 2020-09-22 ENCOUNTER — Other Ambulatory Visit: Payer: Self-pay

## 2020-09-22 VITALS — BP 127/83 | HR 74 | Wt 154.0 lb

## 2020-09-22 DIAGNOSIS — Z3A38 38 weeks gestation of pregnancy: Secondary | ICD-10-CM

## 2020-09-22 DIAGNOSIS — O283 Abnormal ultrasonic finding on antenatal screening of mother: Secondary | ICD-10-CM

## 2020-09-22 DIAGNOSIS — O24410 Gestational diabetes mellitus in pregnancy, diet controlled: Secondary | ICD-10-CM

## 2020-09-22 DIAGNOSIS — Z3403 Encounter for supervision of normal first pregnancy, third trimester: Secondary | ICD-10-CM

## 2020-09-22 NOTE — Progress Notes (Signed)
Patient complaining of some itching and new fresh stretch marks with bumps on them on her belly. Armandina Stammer RN

## 2020-09-22 NOTE — Progress Notes (Signed)
   PRENATAL VISIT NOTE  Subjective:  Patricia Delgado is a 30 y.o. G1P0 at [redacted]w[redacted]d being seen today for ongoing prenatal care.  She is currently monitored for the following issues for this high-risk pregnancy and has Supervision of normal first pregnancy; Abnormal fetal ultrasound; and Gestational diabetes on their problem list.  Patient reports abdominal itching.  Contractions: Irritability. Vag. Bleeding: None.  Movement: Present. Denies leaking of fluid.   The following portions of the patient's history were reviewed and updated as appropriate: allergies, current medications, past family history, past medical history, past social history, past surgical history and problem list.   Objective:   Vitals:   09/22/20 1016  BP: 127/83  Pulse: 74  Weight: 154 lb (69.9 kg)    Fetal Status: Fetal Heart Rate (bpm): 142   Movement: Present     General:  Alert, oriented and cooperative. Patient is in no acute distress.  Skin: Skin is warm and dry. No rash noted.   Cardiovascular: Normal heart rate noted  Respiratory: Normal respiratory effort, no problems with respiration noted  Abdomen: Soft, gravid, appropriate for gestational age.  Pain/Pressure: Present     Pelvic: Cervical exam deferred        Extremities: Normal range of motion.  Edema: Trace  Mental Status: Normal mood and affect. Normal behavior. Normal judgment and thought content.   Assessment and Plan:  Pregnancy: G1P0 at [redacted]w[redacted]d 1. [redacted] weeks gestation of pregnancy  2. Encounter for supervision of normal first pregnancy in third trimester FHT and FH normal. Benadryl for itching (mostly at night)  3. Diet controlled gestational diabetes mellitus (GDM) in third trimester Fairly well controlled.  Delivery at 40 weeks (scheduled and orders placed) Will check cervix - likely can do foley balloon day prior to induction. May need to be placed in MAU.  4. Abnormal fetal ultrasound LV EIF  Term labor symptoms and general obstetric  precautions including but not limited to vaginal bleeding, contractions, leaking of fluid and fetal movement were reviewed in detail with the patient. Please refer to After Visit Summary for other counseling recommendations.   No follow-ups on file.  Future Appointments  Date Time Provider Department Center  09/28/2020  9:30 AM Jerene Bears, MD [facility] None    Levie Heritage, DO

## 2020-09-23 ENCOUNTER — Telehealth (HOSPITAL_COMMUNITY): Payer: Self-pay | Admitting: *Deleted

## 2020-09-23 ENCOUNTER — Other Ambulatory Visit: Payer: Self-pay | Admitting: Advanced Practice Midwife

## 2020-09-23 ENCOUNTER — Encounter (HOSPITAL_COMMUNITY): Payer: Self-pay | Admitting: *Deleted

## 2020-09-23 NOTE — Telephone Encounter (Signed)
Preadmission screen  

## 2020-09-27 ENCOUNTER — Inpatient Hospital Stay (HOSPITAL_COMMUNITY): Payer: BC Managed Care – PPO | Admitting: Anesthesiology

## 2020-09-27 ENCOUNTER — Ambulatory Visit (INDEPENDENT_AMBULATORY_CARE_PROVIDER_SITE_OTHER): Payer: BC Managed Care – PPO | Admitting: Obstetrics & Gynecology

## 2020-09-27 ENCOUNTER — Inpatient Hospital Stay (HOSPITAL_COMMUNITY)
Admission: AD | Admit: 2020-09-27 | Discharge: 2020-10-01 | DRG: 787 | Disposition: A | Discharge status: Home / Self Care | Payer: BC Managed Care – PPO | Attending: Obstetrics and Gynecology | Admitting: Obstetrics and Gynecology

## 2020-09-27 ENCOUNTER — Other Ambulatory Visit: Payer: Self-pay

## 2020-09-27 ENCOUNTER — Encounter (HOSPITAL_COMMUNITY): Payer: Self-pay | Admitting: Family Medicine

## 2020-09-27 VITALS — BP 134/95 | HR 84

## 2020-09-27 DIAGNOSIS — O9081 Anemia of the puerperium: Secondary | ICD-10-CM | POA: Diagnosis not present

## 2020-09-27 DIAGNOSIS — O1424 HELLP syndrome, complicating childbirth: Secondary | ICD-10-CM | POA: Diagnosis not present

## 2020-09-27 DIAGNOSIS — Z20822 Contact with and (suspected) exposure to covid-19: Secondary | ICD-10-CM | POA: Diagnosis not present

## 2020-09-27 DIAGNOSIS — O24410 Gestational diabetes mellitus in pregnancy, diet controlled: Secondary | ICD-10-CM

## 2020-09-27 DIAGNOSIS — Z3A00 Weeks of gestation of pregnancy not specified: Secondary | ICD-10-CM | POA: Diagnosis not present

## 2020-09-27 DIAGNOSIS — O1420 HELLP syndrome (HELLP), unspecified trimester: Secondary | ICD-10-CM | POA: Diagnosis not present

## 2020-09-27 DIAGNOSIS — O134 Gestational [pregnancy-induced] hypertension without significant proteinuria, complicating childbirth: Secondary | ICD-10-CM | POA: Diagnosis not present

## 2020-09-27 DIAGNOSIS — O24420 Gestational diabetes mellitus in childbirth, diet controlled: Secondary | ICD-10-CM | POA: Diagnosis present

## 2020-09-27 DIAGNOSIS — Z3A39 39 weeks gestation of pregnancy: Secondary | ICD-10-CM

## 2020-09-27 DIAGNOSIS — O2686 Pruritic urticarial papules and plaques of pregnancy (PUPPP): Secondary | ICD-10-CM

## 2020-09-27 DIAGNOSIS — D62 Acute posthemorrhagic anemia: Secondary | ICD-10-CM | POA: Diagnosis not present

## 2020-09-27 DIAGNOSIS — O139 Gestational [pregnancy-induced] hypertension without significant proteinuria, unspecified trimester: Secondary | ICD-10-CM

## 2020-09-27 DIAGNOSIS — O133 Gestational [pregnancy-induced] hypertension without significant proteinuria, third trimester: Secondary | ICD-10-CM

## 2020-09-27 DIAGNOSIS — O411230 Chorioamnionitis, third trimester, not applicable or unspecified: Secondary | ICD-10-CM | POA: Diagnosis not present

## 2020-09-27 DIAGNOSIS — O24429 Gestational diabetes mellitus in childbirth, unspecified control: Secondary | ICD-10-CM | POA: Diagnosis not present

## 2020-09-27 DIAGNOSIS — O1414 Severe pre-eclampsia complicating childbirth: Principal | ICD-10-CM | POA: Diagnosis not present

## 2020-09-27 DIAGNOSIS — O24419 Gestational diabetes mellitus in pregnancy, unspecified control: Secondary | ICD-10-CM | POA: Diagnosis present

## 2020-09-27 LAB — PROTEIN / CREATININE RATIO, URINE
Creatinine, Urine: 56.4 mg/dL
Protein Creatinine Ratio: 0.55 mg/mg{Cre} — ABNORMAL HIGH (ref 0.00–0.15)
Total Protein, Urine: 31 mg/dL

## 2020-09-27 LAB — COMPREHENSIVE METABOLIC PANEL
ALT: 53 U/L — ABNORMAL HIGH (ref 0–44)
ALT: 55 U/L — ABNORMAL HIGH (ref 0–44)
AST: 46 U/L — ABNORMAL HIGH (ref 15–41)
AST: 45 U/L — ABNORMAL HIGH (ref 15–41)
Albumin: 2.7 g/dL — ABNORMAL LOW (ref 3.5–5.0)
Albumin: 2.5 g/dL — ABNORMAL LOW (ref 3.5–5.0)
Alkaline Phosphatase: 156 U/L — ABNORMAL HIGH (ref 38–126)
Alkaline Phosphatase: 148 U/L — ABNORMAL HIGH (ref 38–126)
Anion gap: 13 (ref 5–15)
Anion gap: 10 (ref 5–15)
BUN: 9 mg/dL (ref 6–20)
BUN: 7 mg/dL (ref 6–20)
CO2: 14 mmol/L — ABNORMAL LOW (ref 22–32)
CO2: 20 mmol/L — ABNORMAL LOW (ref 22–32)
Calcium: 9 mg/dL (ref 8.9–10.3)
Calcium: 8.7 mg/dL — ABNORMAL LOW (ref 8.9–10.3)
Chloride: 106 mmol/L (ref 98–111)
Chloride: 102 mmol/L (ref 98–111)
Creatinine, Ser: 0.72 mg/dL (ref 0.44–1.00)
Creatinine, Ser: 0.77 mg/dL (ref 0.44–1.00)
GFR, Estimated: >60 mL/min (ref >60)
GFR, Estimated: >60 mL/min (ref >60)
Glucose, Bld: 61 mg/dL — ABNORMAL LOW (ref 70–99)
Glucose, Bld: 64 mg/dL — ABNORMAL LOW (ref 70–99)
Potassium: 4.3 mmol/L (ref 3.5–5.1)
Potassium: 3.8 mmol/L (ref 3.5–5.1)
Sodium: 133 mmol/L — ABNORMAL LOW (ref 135–145)
Sodium: 132 mmol/L — ABNORMAL LOW (ref 135–145)
Total Bilirubin: 0.9 mg/dL (ref 0.3–1.2)
Total Bilirubin: 0.9 mg/dL (ref 0.3–1.2)
Total Protein: 6.1 g/dL — ABNORMAL LOW (ref 6.5–8.1)
Total Protein: 5.8 g/dL — ABNORMAL LOW (ref 6.5–8.1)

## 2020-09-27 LAB — POCT URINALYSIS DIPSTICK OB

## 2020-09-27 LAB — CBC
HCT: 41.3 % (ref 36.0–46.0)
HCT: 38.5 % (ref 36.0–46.0)
Hemoglobin: 13.4 g/dL (ref 12.0–15.0)
Hemoglobin: 13 g/dL (ref 12.0–15.0)
MCH: 30.6 pg (ref 26.0–34.0)
MCH: 30.6 pg (ref 26.0–34.0)
MCHC: 32.4 g/dL (ref 30.0–36.0)
MCHC: 33.8 g/dL (ref 30.0–36.0)
MCV: 94.3 fL (ref 80.0–100.0)
MCV: 90.6 fL (ref 80.0–100.0)
Platelets: 138 10*3/uL — ABNORMAL LOW (ref 150–400)
Platelets: 145 10*3/uL — ABNORMAL LOW (ref 150–400)
RBC: 4.38 MIL/uL (ref 3.87–5.11)
RBC: 4.25 MIL/uL (ref 3.87–5.11)
RDW: 14.8 % (ref 11.5–15.5)
RDW: 14.8 % (ref 11.5–15.5)
WBC: 9.2 10*3/uL (ref 4.0–10.5)
WBC: 11.1 10*3/uL — ABNORMAL HIGH (ref 4.0–10.5)
nRBC: 0 % (ref 0.0–0.2)
nRBC: 0 % (ref 0.0–0.2)

## 2020-09-27 LAB — TYPE AND SCREEN
ABO/RH(D): A POS
Antibody Screen: NEGATIVE

## 2020-09-27 LAB — RESP PANEL BY RT-PCR (FLU A&B, COVID) ARPGX2
Influenza A by PCR: NEGATIVE
Influenza B by PCR: NEGATIVE
SARS Coronavirus 2 by RT PCR: NEGATIVE

## 2020-09-27 LAB — GLUCOSE, CAPILLARY: Glucose-Capillary: 59 mg/dL — ABNORMAL LOW (ref 70–99)

## 2020-09-27 MED ORDER — PHENYLEPHRINE 40 MCG/ML (10ML) SYRINGE FOR IV PUSH (FOR BLOOD PRESSURE SUPPORT)
80.0000 ug | PREFILLED_SYRINGE | INTRAVENOUS | Status: DC | PRN
  Administered 2020-09-28: 80 ug via INTRAVENOUS
  Filled 2020-09-27: qty 10

## 2020-09-27 MED ORDER — PHENYLEPHRINE 40 MCG/ML (10ML) SYRINGE FOR IV PUSH (FOR BLOOD PRESSURE SUPPORT): 80.0000 ug | PREFILLED_SYRINGE | INTRAVENOUS | Status: DC | PRN

## 2020-09-27 MED ORDER — OXYTOCIN BOLUS FROM INFUSION: 333.0000 mL | Freq: Once | INTRAVENOUS | Status: DC

## 2020-09-27 MED ORDER — DIPHENHYDRAMINE HCL 50 MG/ML IJ SOLN: 12.5000 mg | INTRAMUSCULAR | Status: DC | PRN

## 2020-09-27 MED ORDER — MAGNESIUM SULFATE BOLUS VIA INFUSION
4.0000 g | Freq: Once | INTRAVENOUS | Status: AC
  Administered 2020-09-27: 4 g via INTRAVENOUS
  Filled 2020-09-27: qty 1000

## 2020-09-27 MED ORDER — ACETAMINOPHEN 325 MG PO TABS: 650.0000 mg | ORAL_TABLET | ORAL | Status: DC | PRN

## 2020-09-27 MED ORDER — LABETALOL HCL 5 MG/ML IV SOLN: 20.0000 mg | INTRAVENOUS | Status: DC | PRN

## 2020-09-27 MED ORDER — MAGNESIUM SULFATE 40 GM/1000ML IV SOLN
2.0000 g/h | INTRAVENOUS | Status: DC
  Administered 2020-09-27 – 2020-09-28 (×2): 2 g/h via INTRAVENOUS
  Filled 2020-09-27 (×2): qty 1000

## 2020-09-27 MED ORDER — OXYTOCIN-SODIUM CHLORIDE 30-0.9 UT/500ML-% IV SOLN
1.0000 m[IU]/min | INTRAVENOUS | Status: DC
  Administered 2020-09-27: 2 m[IU]/min via INTRAVENOUS
  Filled 2020-09-27: qty 500

## 2020-09-27 MED ORDER — DIPHENHYDRAMINE HCL 25 MG PO CAPS
25.0000 mg | ORAL_CAPSULE | Freq: Four times a day (QID) | ORAL | Status: DC | PRN
  Administered 2020-09-27: 25 mg via ORAL
  Filled 2020-09-27: qty 1

## 2020-09-27 MED ORDER — EPHEDRINE 5 MG/ML INJ: 10.0000 mg | INTRAVENOUS | Status: DC | PRN

## 2020-09-27 MED ORDER — LABETALOL HCL 5 MG/ML IV SOLN: 40.0000 mg | INTRAVENOUS | Status: DC | PRN

## 2020-09-27 MED ORDER — LABETALOL HCL 5 MG/ML IV SOLN: 80.0000 mg | INTRAVENOUS | Status: DC | PRN

## 2020-09-27 MED ORDER — LIDOCAINE HCL (PF) 1 % IJ SOLN: 30.0000 mL | INTRAMUSCULAR | Status: DC | PRN

## 2020-09-27 MED ORDER — ONDANSETRON HCL 4 MG/2ML IJ SOLN
4.0000 mg | Freq: Four times a day (QID) | INTRAMUSCULAR | Status: DC | PRN
  Administered 2020-09-28: 4 mg via INTRAVENOUS
  Filled 2020-09-27: qty 2

## 2020-09-27 MED ORDER — LIDOCAINE HCL (PF) 1 % IJ SOLN
INTRAMUSCULAR | Status: DC | PRN
  Administered 2020-09-27: 7 mL via EPIDURAL
  Administered 2020-09-27: 2 mL via EPIDURAL

## 2020-09-27 MED ORDER — LACTATED RINGERS IV SOLN
500.0000 mL | Freq: Once | INTRAVENOUS | Status: AC
  Administered 2020-09-27: 250 mL via INTRAVENOUS

## 2020-09-27 MED ORDER — OXYTOCIN-SODIUM CHLORIDE 30-0.9 UT/500ML-% IV SOLN: 2.5000 [IU]/h | INTRAVENOUS | Status: DC

## 2020-09-27 MED ORDER — OXYCODONE-ACETAMINOPHEN 5-325 MG PO TABS: 1.0000 | ORAL_TABLET | ORAL | Status: DC | PRN

## 2020-09-27 MED ORDER — FENTANYL-BUPIVACAINE-NACL 0.5-0.125-0.9 MG/250ML-% EP SOLN
12.0000 mL/h | EPIDURAL | Status: DC | PRN
Start: 2020-09-27 — End: 2020-09-28
  Administered 2020-09-28: 12 mL/h via EPIDURAL
  Filled 2020-09-27: qty 250

## 2020-09-27 MED ORDER — FENTANYL CITRATE (PF) 100 MCG/2ML IJ SOLN: 100.0000 ug | INTRAMUSCULAR | Status: DC | PRN

## 2020-09-27 MED ORDER — SODIUM CHLORIDE (PF) 0.9 % IJ SOLN
INTRAMUSCULAR | Status: DC | PRN
  Administered 2020-09-27: 10 mL/h via EPIDURAL

## 2020-09-27 MED ORDER — HYDRALAZINE HCL 20 MG/ML IJ SOLN: 10.0000 mg | INTRAMUSCULAR | Status: DC | PRN

## 2020-09-27 MED ORDER — FENTANYL-BUPIVACAINE-NACL 0.5-0.125-0.9 MG/250ML-% EP SOLN
EPIDURAL | Status: AC
  Filled 2020-09-27: qty 250

## 2020-09-27 MED ORDER — LACTATED RINGERS IV SOLN: 500.0000 mL | INTRAVENOUS | Status: DC | PRN

## 2020-09-27 MED ORDER — TERBUTALINE SULFATE 1 MG/ML IJ SOLN: 0.2500 mg | Freq: Once | INTRAMUSCULAR | Status: DC | PRN

## 2020-09-27 MED ORDER — OXYCODONE-ACETAMINOPHEN 5-325 MG PO TABS
2.0000 | ORAL_TABLET | ORAL | Status: DC | PRN
Start: 2020-09-27 — End: 2020-09-28

## 2020-09-27 MED ORDER — HYDROCORTISONE 1 % EX CREA
TOPICAL_CREAM | Freq: Three times a day (TID) | CUTANEOUS | Status: DC | PRN
  Filled 2020-09-27: qty 28

## 2020-09-27 MED ORDER — LACTATED RINGERS IV SOLN: INTRAVENOUS | Status: DC

## 2020-09-27 MED ORDER — SOD CITRATE-CITRIC ACID 500-334 MG/5ML PO SOLN
30.0000 mL | ORAL | Status: DC | PRN
  Filled 2020-09-27: qty 15

## 2020-09-27 NOTE — Progress Notes (Signed)
Labor Progress Note Patricia Delgado is a 30 y.o. G1P0 at [redacted]w[redacted]d presented for IOL for HTN  S:  Comfortable, bouncing on ball. Feels some ctx.  O:  BP (!) 134/96   Pulse 89   Temp 97.9 F (36.6 C) (Oral)   Resp 18   Ht 4\' 11"  (1.499 m)   Wt 71.2 kg   LMP 12/28/2019   BMI 31.71 kg/m  EFM: baseline 135 bpm/ mod variability/ + accels/ no decels  Toco: 2-3 SVE: Dilation: 3 Effacement (%): 80 Station: -2 Presentation: Vertex Exam by:: Patricia Delgado cnm Pitocin: 6 mu/min  A/P: 30 y.o. G1P0 [redacted]w[redacted]d  1. Labor: latent 2. FWB: Cat I 3. Pain: analgesia/anesthesia prn 4. A1GDM: stable 5. HELLP  Discussed new dx of HELLP with pt and family. Plan for MgSO4 and q6 labs. All questions answered. Continue Pitocin titration. Anticipate labor progress and SVD. Dr. [redacted]w[redacted]d updated with new dx and agrees with plan.  Jolayne Panther, CNM 6:57 PM

## 2020-09-27 NOTE — Anesthesia Procedure Notes (Signed)
Epidural Patient location during procedure: OB Start time: 09/27/2020 8:05 PM End time: 09/27/2020 8:13 PM  Staffing Anesthesiologist: Lannie Fields, DO Performed: anesthesiologist   Preanesthetic Checklist Completed: patient identified, IV checked, risks and benefits discussed, monitors and equipment checked, pre-op evaluation and timeout performed  Epidural Patient position: sitting Prep: DuraPrep and site prepped and draped Patient monitoring: continuous pulse ox, blood pressure, heart rate and cardiac monitor Approach: midline Location: L3-L4 Injection technique: LOR air  Needle:  Needle type: Tuohy  Needle gauge: 17 G Needle length: 9 cm Needle insertion depth: 5 cm Catheter type: closed end flexible Catheter size: 19 Gauge Catheter at skin depth: 10 cm Test dose: negative  Assessment Sensory level: T8 Events: blood not aspirated, injection not painful, no injection resistance, no paresthesia and negative IV test  Additional Notes Patient identified. Risks/Benefits/Options discussed with patient including but not limited to bleeding, infection, nerve damage, paralysis, failed block, incomplete pain control, headache, blood pressure changes, nausea, vomiting, reactions to medication both or allergic, itching and postpartum back pain. Confirmed with bedside nurse the patient's most recent platelet count. Confirmed with patient that they are not currently taking any anticoagulation, have any bleeding history or any family history of bleeding disorders. Patient expressed understanding and wished to proceed. All questions were answered. Sterile technique was used throughout the entire procedure. Please see nursing notes for vital signs. Test dose was given through epidural catheter and negative prior to continuing to dose epidural or start infusion. Warning signs of high block given to the patient including shortness of breath, tingling/numbness in hands, complete motor  block, or any concerning symptoms with instructions to call for help. Patient was given instructions on fall risk and not to get out of bed. All questions and concerns addressed with instructions to call with any issues or inadequate analgesia.  Reason for block:procedure for pain

## 2020-09-27 NOTE — H&P (Signed)
OBSTETRIC ADMISSION HISTORY AND PHYSICAL  Patricia Delgado is a 30 y.o. female G1P0 with IUP at 27w1dpresenting for gHTN. She reports +FMs. No LOF, VB, blurry vision, headaches, peripheral edema, or RUQ pain. She plans on breastfeeding. She requests POPs for birth control.  Dating: By LMP --->  Estimated Date of Delivery: 10/03/20  Sono:    '@[redacted]w[redacted]d' , normal anatomy, ceph presentation, 3211g, 82%ile, EFW 7'1   Prenatal History/Complications: - AK1SWF- gHTN - PUPPS  Past Medical History: Past Medical History:  Diagnosis Date  . Gestational diabetes     Past Surgical History: No past surgical history on file.  Obstetrical History: OB History    Gravida  1   Para      Term      Preterm      AB      Living        SAB      IAB      Ectopic      Multiple      Live Births              Social History: Social History   Socioeconomic History  . Marital status: Married    Spouse name: NEmalyn Schou . Number of children: Not on file  . Years of education: 163 . Highest education level: Bachelor's degree (e.g., BA, AB, BS)  Occupational History    Employer: [employer]  Tobacco Use  . Smoking status: Never Smoker  . Smokeless tobacco: Never Used  Vaping Use  . Vaping Use: Never used  Substance and Sexual Activity  . Alcohol use: Not Currently  . Drug use: Never  . Sexual activity: Yes  Other Topics Concern  . Not on file  Social History Narrative  . Not on file   Social Determinants of Health   Financial Resource Strain: Not on file  Food Insecurity: Not on file  Transportation Needs: Not on file  Physical Activity: Not on file  Stress: Not on file  Social Connections: Not on file    Family History: Family History  Problem Relation Age of Onset  . Hypertension Maternal Grandfather   . Stroke Maternal Grandfather   . Diabetes Paternal Grandmother   . Cancer Neg Hx     Allergies: No Known Allergies  Medications Prior to Admission   Medication Sig Dispense Refill Last Dose  . aspirin 81 MG chewable tablet Chew 1 tablet (81 mg total) by mouth daily. 30 tablet 3   . Blood Glucose Monitoring Suppl (ONE TOUCH ULTRA 2) w/Device KIT 4 (four) times daily.     . Elastic Bandages & Supports (COMFORT FIT MATERNITY SUPP MED) MISC 1 Device by Does not apply route daily. (Patient not taking: Reported on 09/07/2020) 1 each 1   . glucose blood test strip 1 each by Other route in the morning, at noon, in the evening, and at bedtime. Use for testing four times a day. 1000 strip 12   . Lancets (ONETOUCH ULTRASOFT) lancets Use as instructed 100 each 12   . Prenatal Vit-Fe Fumarate-FA (PRENATAL MULTIVITAMIN) TABS tablet Take 1 tablet by mouth daily at 12 noon.        Review of Systems:  All systems reviewed and negative except as stated in HPI  PE: Last menstrual period 12/28/2019. General appearance: alert, cooperative and no distress Lungs: regular rate and effort Heart: regular rate  Abdomen: soft, non-tender Extremities: Homans sign is negative, no sign of DVT Presentation: cephalic EFM:  135 bpm, mod variability, + accels, no decels Toco: 2-4 SVE: 3/80/-2 BSUS: vtx confirmed  Prenatal labs: ABO, Rh: A/Positive/-- (06/22 1041) Antibody: Negative (06/22 1041) Rubella: 1.19 (06/22 1041) RPR: Non Reactive (11/09 0850)  HBsAg: Negative (06/22 1041)  HIV: Non Reactive (11/09 0848)  GBS: Negative/-- (12/29 0843)  2 hr GTT: 77/191/129  Prenatal Transfer Tool  Maternal Diabetes: Yes:  Diabetes Type:  Diet controlled Genetic Screening: Normal Maternal Ultrasounds/Referrals: Normal Fetal Ultrasounds or other Referrals:  None Maternal Substance Abuse:  No Significant Maternal Medications:  None Significant Maternal Lab Results: Group B Strep negative  Results for orders placed or performed in visit on 09/27/20 (from the past 24 hour(s))  POC Urinalysis Dipstick OB   Collection Time: 09/27/20  1:58 PM  Result Value Ref  Range   Color, UA     Clarity, UA     Glucose, UA Trace (A) Negative   Bilirubin, UA     Ketones, UA     Spec Grav, UA     Blood, UA     pH, UA     POC,PROTEIN,UA Small (1+) Negative, Trace, Small (1+), Moderate (2+), Large (3+), 4+   Urobilinogen, UA     Nitrite, UA     Leukocytes, UA     Appearance     Odor      Patient Active Problem List   Diagnosis Date Noted  . Gestational hypertension 09/27/2020  . Gestational diabetes 07/21/2020  . Abnormal fetal ultrasound 05/10/2020  . Supervision of normal first pregnancy 03/01/2020   Assessment: Inette Doubrava is a 30 y.o. G1P0 at 99w1dhere for IOL for gHTN  1. Labor: latent 2. FWB: Cat I 3. Pain: analgesia/anesthesia prn 4. GBS: neg   Plan: Admit to LD IOL with Pitocin GBGs q4 PEC labs  MJulianne Handler CNM  09/27/2020, 3:24 PM

## 2020-09-27 NOTE — Anesthesia Preprocedure Evaluation (Addendum)
Anesthesia Evaluation  Patient identified by MRN, date of birth, ID band Patient awake    Reviewed: Allergy & Precautions, Patient's Chart, lab work & pertinent test results  Airway Mallampati: II  TM Distance: >3 FB Neck ROM: Full    Dental no notable dental hx.    Pulmonary neg pulmonary ROS,    Pulmonary exam normal breath sounds clear to auscultation       Cardiovascular hypertension (HELLP), Normal cardiovascular exam Rhythm:Regular Rate:Normal     Neuro/Psych negative neurological ROS  negative psych ROS   GI/Hepatic negative GI ROS, Neg liver ROS,   Endo/Other  diabetes, Gestational  Renal/GU negative Renal ROS  negative genitourinary   Musculoskeletal negative musculoskeletal ROS (+)   Abdominal   Peds  Hematology  (+) Blood dyscrasia, , HELLP plt down to 138 his afternoon, well below baseline throughout pregnancy   Anesthesia Other Findings   Reproductive/Obstetrics (+) Pregnancy G1P0 HELLP syndrome                            Anesthesia Physical Anesthesia Plan  ASA: III and emergent  Anesthesia Plan: Epidural   Post-op Pain Management:    Induction:   PONV Risk Score and Plan: 2  Airway Management Planned: Natural Airway  Additional Equipment: None  Intra-op Plan:   Post-operative Plan:   Informed Consent: I have reviewed the patients History and Physical, chart, labs and discussed the procedure including the risks, benefits and alternatives for the proposed anesthesia with the patient or authorized representative who has indicated his/her understanding and acceptance.       Plan Discussed with:   Anesthesia Plan Comments:         Anesthesia Quick Evaluation

## 2020-09-27 NOTE — Progress Notes (Signed)
Patient complaining of PUPPS rash. It has spread in the last four days to under her bra line and down towards her groin and around to her back. Last week it was only on her belly.  Patient is feeling miserable and moved appointment up to today because of the intense burning/ itching. Armandina Stammer RN

## 2020-09-27 NOTE — Progress Notes (Signed)
   PRENATAL VISIT NOTE  Subjective:  Patricia Delgado is a 30 y.o. G1P0 at [redacted]w[redacted]d being seen today for ongoing prenatal care.  She is currently monitored for the following issues for this low-risk pregnancy and has Supervision of normal first pregnancy; Abnormal fetal ultrasound; and Gestational diabetes on their problem list.  Patient reports itching is just awful and has worsened since last week.  Had appt tomorrow but called to be seen sooner..  Contractions: Irritability. Vag. Bleeding: None.  Movement: Present. Denies leaking of fluid.   The following portions of the patient's history were reviewed and updated as appropriate: allergies, current medications, past family history, past medical history, past social history, past surgical history and problem list.   Objective:   Vitals:   09/27/20 1338 09/27/20 1350  BP: (!) 139/95 (!) 134/95  Pulse: 84     Fetal Status: Fetal Heart Rate (bpm): 143   Movement: Present     General:  Alert, oriented and cooperative. Patient is in no acute distress.  Skin: Skin is warm and dry. No rash noted.   Cardiovascular: Normal heart rate noted  Respiratory: Normal respiratory effort, no problems with respiration noted  Abdomen: Soft, gravid, appropriate for gestational age.  Pain/Pressure: Present     Pelvic: Cervical exam performed in the presence of a chaperone        Extremities: Normal range of motion.  Edema: Mild pitting, slight indentation  Mental Status: Normal mood and affect. Normal behavior. Normal judgment and thought content.   Assessment and Plan:  Pregnancy: G1P0 at [redacted]w[redacted]d 1. [redacted] weeks gestation of pregnancy - to L&D for induction.  Have discussed with Dr. Angela Nevin, first attending. - on PNV  2. Diet controlled gestational diabetes mellitus (GDM) in third trimester  3. PUPP (pruritic urticarial papules and plaques of pregnancy)  4. PIH (pregnancy induced hypertension), third trimester  Term labor symptoms and general obstetric  precautions including but not limited to vaginal bleeding, contractions, leaking of fluid and fetal movement were reviewed in detail with the patient. Please refer to After Visit Summary for other counseling recommendations.    Future Appointments  Date Time Provider Department Center  10/01/2020 10:10 AM [facility]ENING [facility] None  10/03/2020  8:40 AM [facility] SCHED ROOM [facility] None    Jerene Bears, MD

## 2020-09-27 NOTE — Progress Notes (Signed)
Pt reports leaking of fluid a few minutes after SVE. Small amt clear fluid noted on pad under patient. Sample collected for fern slide. Fern neg. Will continue to monitor for LOF.

## 2020-09-28 ENCOUNTER — Encounter: Payer: BC Managed Care – PPO | Admitting: Obstetrics & Gynecology

## 2020-09-28 ENCOUNTER — Encounter (HOSPITAL_COMMUNITY)
Admission: AD | Disposition: A | Discharge status: Home / Self Care | Payer: Self-pay | Source: Home / Self Care | Attending: Obstetrics and Gynecology

## 2020-09-28 DIAGNOSIS — Z3A39 39 weeks gestation of pregnancy: Secondary | ICD-10-CM

## 2020-09-28 DIAGNOSIS — O1414 Severe pre-eclampsia complicating childbirth: Secondary | ICD-10-CM

## 2020-09-28 LAB — CBC
HCT: 39.6 % (ref 36.0–46.0)
HCT: 39.5 % (ref 36.0–46.0)
HCT: 41.2 % (ref 36.0–46.0)
HCT: 39.6 % (ref 36.0–46.0)
Hemoglobin: 13 g/dL (ref 12.0–15.0)
Hemoglobin: 13.1 g/dL (ref 12.0–15.0)
Hemoglobin: 13.6 g/dL (ref 12.0–15.0)
Hemoglobin: 13 g/dL (ref 12.0–15.0)
MCH: 30.1 pg (ref 26.0–34.0)
MCH: 30.2 pg (ref 26.0–34.0)
MCH: 30.1 pg (ref 26.0–34.0)
MCH: 30 pg (ref 26.0–34.0)
MCHC: 32.8 g/dL (ref 30.0–36.0)
MCHC: 33.2 g/dL (ref 30.0–36.0)
MCHC: 33 g/dL (ref 30.0–36.0)
MCHC: 32.8 g/dL (ref 30.0–36.0)
MCV: 91.7 fL (ref 80.0–100.0)
MCV: 91 fL (ref 80.0–100.0)
MCV: 91.2 fL (ref 80.0–100.0)
MCV: 91.5 fL (ref 80.0–100.0)
Platelets: 154 10*3/uL (ref 150–400)
Platelets: 145 10*3/uL — ABNORMAL LOW (ref 150–400)
Platelets: 164 10*3/uL (ref 150–400)
Platelets: 151 10*3/uL (ref 150–400)
RBC: 4.32 MIL/uL (ref 3.87–5.11)
RBC: 4.34 MIL/uL (ref 3.87–5.11)
RBC: 4.52 MIL/uL (ref 3.87–5.11)
RBC: 4.33 MIL/uL (ref 3.87–5.11)
RDW: 14.6 % (ref 11.5–15.5)
RDW: 14.6 % (ref 11.5–15.5)
RDW: 14.7 % (ref 11.5–15.5)
RDW: 14.7 % (ref 11.5–15.5)
WBC: 16.8 10*3/uL — ABNORMAL HIGH (ref 4.0–10.5)
WBC: 19.2 10*3/uL — ABNORMAL HIGH (ref 4.0–10.5)
WBC: 22.1 10*3/uL — ABNORMAL HIGH (ref 4.0–10.5)
WBC: 21.9 10*3/uL — ABNORMAL HIGH (ref 4.0–10.5)
nRBC: 0 % (ref 0.0–0.2)
nRBC: 0 % (ref 0.0–0.2)
nRBC: 0 % (ref 0.0–0.2)
nRBC: 0 % (ref 0.0–0.2)

## 2020-09-28 LAB — COMPREHENSIVE METABOLIC PANEL
ALT: 57 U/L — ABNORMAL HIGH (ref 0–44)
ALT: 48 U/L — ABNORMAL HIGH (ref 0–44)
AST: 45 U/L — ABNORMAL HIGH (ref 15–41)
AST: 39 U/L (ref 15–41)
Albumin: 2.3 g/dL — ABNORMAL LOW (ref 3.5–5.0)
Albumin: 2 g/dL — ABNORMAL LOW (ref 3.5–5.0)
Alkaline Phosphatase: 138 U/L — ABNORMAL HIGH (ref 38–126)
Alkaline Phosphatase: 141 U/L — ABNORMAL HIGH (ref 38–126)
Anion gap: 10 (ref 5–15)
Anion gap: 13 (ref 5–15)
BUN: 7 mg/dL (ref 6–20)
BUN: 8 mg/dL (ref 6–20)
CO2: 18 mmol/L — ABNORMAL LOW (ref 22–32)
CO2: 15 mmol/L — ABNORMAL LOW (ref 22–32)
Calcium: 8.2 mg/dL — ABNORMAL LOW (ref 8.9–10.3)
Calcium: 8 mg/dL — ABNORMAL LOW (ref 8.9–10.3)
Chloride: 101 mmol/L (ref 98–111)
Chloride: 100 mmol/L (ref 98–111)
Creatinine, Ser: 0.94 mg/dL (ref 0.44–1.00)
Creatinine, Ser: 1.55 mg/dL — ABNORMAL HIGH (ref 0.44–1.00)
GFR, Estimated: >60 mL/min (ref >60)
GFR, Estimated: 46 mL/min — ABNORMAL LOW (ref >60)
Glucose, Bld: 94 mg/dL (ref 70–99)
Glucose, Bld: 130 mg/dL — ABNORMAL HIGH (ref 70–99)
Potassium: 3.8 mmol/L (ref 3.5–5.1)
Potassium: 4.1 mmol/L (ref 3.5–5.1)
Sodium: 129 mmol/L — ABNORMAL LOW (ref 135–145)
Sodium: 128 mmol/L — ABNORMAL LOW (ref 135–145)
Total Bilirubin: 1 mg/dL (ref 0.3–1.2)
Total Bilirubin: 1.2 mg/dL (ref 0.3–1.2)
Total Protein: 5.5 g/dL — ABNORMAL LOW (ref 6.5–8.1)
Total Protein: 5.3 g/dL — ABNORMAL LOW (ref 6.5–8.1)

## 2020-09-28 LAB — COMPREHENSIVE METABOLIC PANEL WITH GFR
ALT: 56 U/L — ABNORMAL HIGH (ref 0–44)
ALT: 58 U/L — ABNORMAL HIGH (ref 0–44)
AST: 45 U/L — ABNORMAL HIGH (ref 15–41)
AST: 43 U/L — ABNORMAL HIGH (ref 15–41)
Albumin: 2.3 g/dL — ABNORMAL LOW (ref 3.5–5.0)
Albumin: 2.2 g/dL — ABNORMAL LOW (ref 3.5–5.0)
Alkaline Phosphatase: 143 U/L — ABNORMAL HIGH (ref 38–126)
Alkaline Phosphatase: 163 U/L — ABNORMAL HIGH (ref 38–126)
Anion gap: 13 (ref 5–15)
Anion gap: 14 (ref 5–15)
BUN: 8 mg/dL (ref 6–20)
BUN: 8 mg/dL (ref 6–20)
CO2: 15 mmol/L — ABNORMAL LOW (ref 22–32)
CO2: 14 mmol/L — ABNORMAL LOW (ref 22–32)
Calcium: 8.2 mg/dL — ABNORMAL LOW (ref 8.9–10.3)
Calcium: 8.3 mg/dL — ABNORMAL LOW (ref 8.9–10.3)
Chloride: 101 mmol/L (ref 98–111)
Chloride: 100 mmol/L (ref 98–111)
Creatinine, Ser: 1.19 mg/dL — ABNORMAL HIGH (ref 0.44–1.00)
Creatinine, Ser: 1.5 mg/dL — ABNORMAL HIGH (ref 0.44–1.00)
GFR, Estimated: >60 mL/min
GFR, Estimated: 48 mL/min — ABNORMAL LOW
Glucose, Bld: 92 mg/dL (ref 70–99)
Glucose, Bld: 96 mg/dL (ref 70–99)
Potassium: 3.6 mmol/L (ref 3.5–5.1)
Potassium: 3.8 mmol/L (ref 3.5–5.1)
Sodium: 129 mmol/L — ABNORMAL LOW (ref 135–145)
Sodium: 128 mmol/L — ABNORMAL LOW (ref 135–145)
Total Bilirubin: 1.3 mg/dL — ABNORMAL HIGH (ref 0.3–1.2)
Total Bilirubin: 1.3 mg/dL — ABNORMAL HIGH (ref 0.3–1.2)
Total Protein: 5.5 g/dL — ABNORMAL LOW (ref 6.5–8.1)
Total Protein: 5.8 g/dL — ABNORMAL LOW (ref 6.5–8.1)

## 2020-09-28 LAB — MAGNESIUM
Magnesium: 7.4 mg/dL (ref 1.7–2.4)
Magnesium: 11.7 mg/dL (ref 1.7–2.4)
Magnesium: 8.9 mg/dL (ref 1.7–2.4)

## 2020-09-28 LAB — GLUCOSE, CAPILLARY
Glucose-Capillary: 101 mg/dL — ABNORMAL HIGH (ref 70–99)
Glucose-Capillary: 75 mg/dL (ref 70–99)
Glucose-Capillary: 79 mg/dL (ref 70–99)
Glucose-Capillary: 116 mg/dL — ABNORMAL HIGH (ref 70–99)

## 2020-09-28 LAB — RPR: RPR Ser Ql: NONREACTIVE

## 2020-09-28 SURGERY — Surgical Case
Anesthesia: Epidural

## 2020-09-28 MED ORDER — NALOXONE HCL 4 MG/10ML IJ SOLN
1.0000 ug/kg/h | INTRAVENOUS | Status: DC | PRN
  Filled 2020-09-28: qty 5

## 2020-09-28 MED ORDER — ONDANSETRON HCL 4 MG/2ML IJ SOLN
4.0000 mg | Freq: Three times a day (TID) | INTRAMUSCULAR | Status: DC | PRN
  Administered 2020-09-29: 4 mg via INTRAVENOUS
  Filled 2020-09-28: qty 2

## 2020-09-28 MED ORDER — LABETALOL HCL 5 MG/ML IV SOLN
INTRAVENOUS | Status: AC
  Filled 2020-09-28: qty 4

## 2020-09-28 MED ORDER — MORPHINE SULFATE (PF) 10 MG/ML IV SOLN
INTRAVENOUS | Status: DC | PRN
  Administered 2020-09-28: 3 mg via EPIDURAL

## 2020-09-28 MED ORDER — COCONUT OIL OIL: 1.0000 "application " | TOPICAL_OIL | Status: DC | PRN

## 2020-09-28 MED ORDER — STERILE WATER FOR IRRIGATION IR SOLN
Status: DC | PRN
  Administered 2020-09-28: 1

## 2020-09-28 MED ORDER — ENOXAPARIN SODIUM 40 MG/0.4ML ~~LOC~~ SOLN
40.0000 mg | SUBCUTANEOUS | Status: DC
  Administered 2020-09-29 – 2020-09-30 (×2): 40 mg via SUBCUTANEOUS
  Filled 2020-09-28 (×2): qty 0.4

## 2020-09-28 MED ORDER — TRANEXAMIC ACID-NACL 1000-0.7 MG/100ML-% IV SOLN
INTRAVENOUS | Status: AC
  Filled 2020-09-28: qty 100

## 2020-09-28 MED ORDER — NALOXONE HCL 0.4 MG/ML IJ SOLN: 0.4000 mg | INTRAMUSCULAR | Status: DC | PRN

## 2020-09-28 MED ORDER — HYDRALAZINE HCL 20 MG/ML IJ SOLN: 10.0000 mg | INTRAMUSCULAR | Status: DC | PRN

## 2020-09-28 MED ORDER — NALBUPHINE HCL 10 MG/ML IJ SOLN: 5.0000 mg | Freq: Once | INTRAMUSCULAR | Status: DC | PRN

## 2020-09-28 MED ORDER — LABETALOL HCL 5 MG/ML IV SOLN: 80.0000 mg | INTRAVENOUS | Status: DC | PRN

## 2020-09-28 MED ORDER — FENTANYL CITRATE (PF) 100 MCG/2ML IJ SOLN: 25.0000 ug | INTRAMUSCULAR | Status: DC | PRN

## 2020-09-28 MED ORDER — CEFAZOLIN SODIUM-DEXTROSE 2-4 GM/100ML-% IV SOLN
2.0000 g | INTRAVENOUS | Status: AC
  Administered 2020-09-28: 2 g via INTRAVENOUS
  Filled 2020-09-28: qty 100

## 2020-09-28 MED ORDER — DEXAMETHASONE SODIUM PHOSPHATE 4 MG/ML IJ SOLN
INTRAMUSCULAR | Status: DC | PRN
  Administered 2020-09-28: 4 mg via INTRAVENOUS

## 2020-09-28 MED ORDER — FENTANYL CITRATE (PF) 100 MCG/2ML IJ SOLN
INTRAMUSCULAR | Status: DC | PRN
  Administered 2020-09-28: 100 ug via EPIDURAL

## 2020-09-28 MED ORDER — SIMETHICONE 80 MG PO CHEW
80.0000 mg | CHEWABLE_TABLET | Freq: Three times a day (TID) | ORAL | Status: DC
  Administered 2020-09-29 – 2020-10-01 (×6): 80 mg via ORAL
  Filled 2020-09-28 (×7): qty 1

## 2020-09-28 MED ORDER — OXYCODONE HCL 5 MG PO TABS: 5.0000 mg | ORAL_TABLET | ORAL | Status: DC | PRN

## 2020-09-28 MED ORDER — FENTANYL CITRATE (PF) 100 MCG/2ML IJ SOLN
25.0000 ug | INTRAMUSCULAR | Status: DC | PRN
Start: 2020-09-28 — End: 2020-09-28
  Administered 2020-09-28: 50 ug via INTRAVENOUS

## 2020-09-28 MED ORDER — OXYTOCIN-SODIUM CHLORIDE 30-0.9 UT/500ML-% IV SOLN
INTRAVENOUS | Status: DC | PRN
  Administered 2020-09-28: 30 mL via INTRAVENOUS

## 2020-09-28 MED ORDER — DEXAMETHASONE SODIUM PHOSPHATE 4 MG/ML IJ SOLN
INTRAMUSCULAR | Status: AC
  Filled 2020-09-28: qty 1

## 2020-09-28 MED ORDER — OXYTOCIN-SODIUM CHLORIDE 30-0.9 UT/500ML-% IV SOLN: 2.5000 [IU]/h | INTRAVENOUS | Status: AC

## 2020-09-28 MED ORDER — MENTHOL 3 MG MT LOZG: 1.0000 | LOZENGE | OROMUCOSAL | Status: DC | PRN

## 2020-09-28 MED ORDER — SIMETHICONE 80 MG PO CHEW: 80.0000 mg | CHEWABLE_TABLET | ORAL | Status: DC | PRN

## 2020-09-28 MED ORDER — DIPHENHYDRAMINE HCL 50 MG/ML IJ SOLN: 12.5000 mg | INTRAMUSCULAR | Status: DC | PRN

## 2020-09-28 MED ORDER — FENTANYL CITRATE (PF) 100 MCG/2ML IJ SOLN
INTRAMUSCULAR | Status: AC
  Filled 2020-09-28: qty 2

## 2020-09-28 MED ORDER — DIPHENHYDRAMINE HCL 25 MG PO CAPS
25.0000 mg | ORAL_CAPSULE | ORAL | Status: DC | PRN
  Filled 2020-09-28: qty 1

## 2020-09-28 MED ORDER — SODIUM CHLORIDE 0.9 % IV SOLN
500.0000 mg | INTRAVENOUS | Status: AC
  Administered 2020-09-28: 500 mg via INTRAVENOUS
  Filled 2020-09-28: qty 500

## 2020-09-28 MED ORDER — SODIUM CHLORIDE 0.9 % IR SOLN
Status: DC | PRN
  Administered 2020-09-28: 1

## 2020-09-28 MED ORDER — LABETALOL HCL 5 MG/ML IV SOLN: 40.0000 mg | INTRAVENOUS | Status: DC | PRN

## 2020-09-28 MED ORDER — SOD CITRATE-CITRIC ACID 500-334 MG/5ML PO SOLN
30.0000 mL | ORAL | Status: AC
  Administered 2020-09-28: 30 mL via ORAL

## 2020-09-28 MED ORDER — IBUPROFEN 800 MG PO TABS
800.0000 mg | ORAL_TABLET | Freq: Four times a day (QID) | ORAL | Status: DC | PRN
Start: 2020-09-28 — End: 2020-10-01
  Administered 2020-09-30: 800 mg via ORAL
  Filled 2020-09-28: qty 1

## 2020-09-28 MED ORDER — NALBUPHINE HCL 10 MG/ML IJ SOLN: 5.0000 mg | INTRAMUSCULAR | Status: DC | PRN

## 2020-09-28 MED ORDER — PHENYLEPHRINE HCL (PRESSORS) 10 MG/ML IV SOLN
INTRAVENOUS | Status: DC | PRN
  Administered 2020-09-28 (×2): 80 ug via INTRAVENOUS

## 2020-09-28 MED ORDER — SODIUM CHLORIDE 0.9% FLUSH: 3.0000 mL | INTRAVENOUS | Status: DC | PRN

## 2020-09-28 MED ORDER — SODIUM BICARBONATE 8.4 % IV SOLN
INTRAVENOUS | Status: DC | PRN
  Administered 2020-09-28: 10 mL via EPIDURAL

## 2020-09-28 MED ORDER — OXYTOCIN-SODIUM CHLORIDE 30-0.9 UT/500ML-% IV SOLN
INTRAVENOUS | Status: AC
  Filled 2020-09-28: qty 500

## 2020-09-28 MED ORDER — ONDANSETRON HCL 4 MG/2ML IJ SOLN
INTRAMUSCULAR | Status: AC
  Filled 2020-09-28: qty 2

## 2020-09-28 MED ORDER — ACETAMINOPHEN 500 MG PO TABS
1000.0000 mg | ORAL_TABLET | Freq: Three times a day (TID) | ORAL | Status: DC
  Administered 2020-09-28 – 2020-10-01 (×8): 1000 mg via ORAL
  Filled 2020-09-28 (×8): qty 2

## 2020-09-28 MED ORDER — PHENYLEPHRINE 40 MCG/ML (10ML) SYRINGE FOR IV PUSH (FOR BLOOD PRESSURE SUPPORT)
PREFILLED_SYRINGE | INTRAVENOUS | Status: AC
  Filled 2020-09-28: qty 10

## 2020-09-28 MED ORDER — MEPERIDINE HCL 25 MG/ML IJ SOLN: 6.2500 mg | INTRAMUSCULAR | Status: DC | PRN

## 2020-09-28 MED ORDER — DIBUCAINE (PERIANAL) 1 % EX OINT: 1.0000 "application " | TOPICAL_OINTMENT | CUTANEOUS | Status: DC | PRN

## 2020-09-28 MED ORDER — LACTATED RINGERS IV SOLN: INTRAVENOUS | Status: AC

## 2020-09-28 MED ORDER — SODIUM CHLORIDE 0.9 % IV SOLN: INTRAVENOUS | Status: DC | PRN

## 2020-09-28 MED ORDER — SENNOSIDES-DOCUSATE SODIUM 8.6-50 MG PO TABS: 2.0000 | ORAL_TABLET | ORAL | Status: DC

## 2020-09-28 MED ORDER — PRENATAL MULTIVITAMIN CH
1.0000 | ORAL_TABLET | Freq: Every day | ORAL | Status: DC
  Administered 2020-09-29 – 2020-10-01 (×3): 1 via ORAL
  Filled 2020-09-28 (×3): qty 1

## 2020-09-28 MED ORDER — TETANUS-DIPHTH-ACELL PERTUSSIS 5-2.5-18.5 LF-MCG/0.5 IM SUSY: 0.5000 mL | PREFILLED_SYRINGE | Freq: Once | INTRAMUSCULAR | Status: DC

## 2020-09-28 MED ORDER — LABETALOL HCL 5 MG/ML IV SOLN
20.0000 mg | INTRAVENOUS | Status: DC | PRN
Start: 2020-09-28 — End: 2020-09-29
  Administered 2020-09-28: 20 mg via INTRAVENOUS

## 2020-09-28 MED ORDER — DIPHENHYDRAMINE HCL 25 MG PO CAPS: 25.0000 mg | ORAL_CAPSULE | Freq: Four times a day (QID) | ORAL | Status: DC | PRN

## 2020-09-28 MED ORDER — SCOPOLAMINE 1 MG/3DAYS TD PT72
MEDICATED_PATCH | TRANSDERMAL | Status: AC
  Filled 2020-09-28: qty 1

## 2020-09-28 MED ORDER — WITCH HAZEL-GLYCERIN EX PADS: 1.0000 "application " | MEDICATED_PAD | CUTANEOUS | Status: DC | PRN

## 2020-09-28 MED ORDER — METOCLOPRAMIDE HCL 5 MG/ML IJ SOLN
INTRAMUSCULAR | Status: AC
  Filled 2020-09-28: qty 2

## 2020-09-28 MED ORDER — MORPHINE SULFATE (PF) 0.5 MG/ML IJ SOLN
INTRAMUSCULAR | Status: AC
  Filled 2020-09-28: qty 10

## 2020-09-28 MED ORDER — SODIUM BICARBONATE 8.4 % IV SOLN
INTRAVENOUS | Status: AC
  Filled 2020-09-28: qty 50

## 2020-09-28 MED ORDER — CEFAZOLIN SODIUM-DEXTROSE 2-3 GM-%(50ML) IV SOLR
INTRAVENOUS | Status: DC | PRN
  Administered 2020-09-28: 2 g via INTRAVENOUS

## 2020-09-28 SURGICAL SUPPLY — 32 items
BENZOIN TINCTURE PRP APPL 2/3 (GAUZE/BANDAGES/DRESSINGS) ×2 IMPLANT
CLOSURE STERI-STRIP 1/4X4 (GAUZE/BANDAGES/DRESSINGS) ×2 IMPLANT
CLOTH BEACON ORANGE TIMEOUT ST (SAFETY) ×2 IMPLANT
DRSG OPSITE POSTOP 4X10 (GAUZE/BANDAGES/DRESSINGS) ×2 IMPLANT
ELECT REM PT RETURN 9FT ADLT (ELECTROSURGICAL) ×2
ELECTRODE REM PT RTRN 9FT ADLT (ELECTROSURGICAL) ×1 IMPLANT
EXTRACTOR VACUUM KIWI (MISCELLANEOUS) IMPLANT
GAUZE SPONGE 4X4 12PLY STRL LF (GAUZE/BANDAGES/DRESSINGS) ×4 IMPLANT
GLOVE BIOGEL PI IND STRL 7.0 (GLOVE) ×1 IMPLANT
GLOVE BIOGEL PI INDICATOR 7.0 (GLOVE) ×1
GLOVE SURG ORTHO 8.0 STRL STRW (GLOVE) ×2 IMPLANT
GOWN STRL REUS W/TWL LRG LVL3 (GOWN DISPOSABLE) ×4 IMPLANT
KIT ABG SYR 3ML LUER SLIP (SYRINGE) IMPLANT
NEEDLE HYPO 25X5/8 SAFETYGLIDE (NEEDLE) IMPLANT
NS IRRIG 1000ML POUR BTL (IV SOLUTION) ×2 IMPLANT
PACK C SECTION WH (CUSTOM PROCEDURE TRAY) ×2 IMPLANT
PAD ABD 8X10 STRL (GAUZE/BANDAGES/DRESSINGS) ×2 IMPLANT
PAD OB MATERNITY 4.3X12.25 (PERSONAL CARE ITEMS) ×2 IMPLANT
PENCIL SMOKE EVAC W/HOLSTER (ELECTROSURGICAL) ×2 IMPLANT
RTRCTR C-SECT PINK 25CM LRG (MISCELLANEOUS) IMPLANT
SPONGE LAP 18X18 X RAY DECT (DISPOSABLE) ×4 IMPLANT
STRIP CLOSURE SKIN 1/2X4 (GAUZE/BANDAGES/DRESSINGS) IMPLANT
SUT MON AB-0 CT1 36 (SUTURE) ×4 IMPLANT
SUT PLAIN 0 NONE (SUTURE) IMPLANT
SUT VIC AB 0 CT1 27 (SUTURE) ×2
SUT VIC AB 0 CT1 27XBRD ANBCTR (SUTURE) ×2 IMPLANT
SUT VIC AB 2-0 CT1 27 (SUTURE) ×1
SUT VIC AB 2-0 CT1 TAPERPNT 27 (SUTURE) ×1 IMPLANT
SUT VIC AB 4-0 KS 27 (SUTURE) ×2 IMPLANT
TOWEL OR 17X24 6PK STRL BLUE (TOWEL DISPOSABLE) ×2 IMPLANT
TRAY FOLEY W/BAG SLVR 14FR LF (SET/KITS/TRAYS/PACK) ×2 IMPLANT
WATER STERILE IRR 1000ML POUR (IV SOLUTION) ×2 IMPLANT

## 2020-09-28 NOTE — Progress Notes (Signed)
Labor Progress Note Caley Ciaramitaro is a 30 y.o. G1P0 at [redacted]w[redacted]d presented for IOL-HELLP.  S: Doing well without complaints.  O:  BP (!) 151/100   Pulse 97   Temp 98.1 F (36.7 C) (Axillary)   Resp 16   Ht 4\' 11"  (1.499 m)   Wt 71.2 kg   LMP 12/28/2019   SpO2 100%   BMI 31.71 kg/m  EFM: baseline 125 bpm/ mod variability/ + accels/intermittent late decels with contractions Toco: 2-3 SVE: Dilation: 5 Effacement (%): 80 Station: -2 Presentation: Vertex Exam by:: [email or web address], MD  A/P: 30 y.o. G1P0 [redacted]w[redacted]d  1. IOL: pit @1700 . SROM @2315 . Good cervical change since last exam, continue to titrate pitocin. 2. FWB: Cat II, overall reassuring 3. Pain: epidural 4. A1GDM: q4 BGL checks in latent labor, q2 in active labor. BGL stable. 5. HELLP: elevated BP in clinic and on admission. Asymptomatic. Mg. No anti-htn meds currently. Repeat labs stable. Continue to monitor.   [redacted]w[redacted]d, MD 12:40 AM

## 2020-09-28 NOTE — Progress Notes (Signed)
Labor Progress Note Patricia Delgado is a 30 y.o. G1P0 at [redacted]w[redacted]d presented for IOL-HELLP.  S: Very exhausted, feeling weak   O:  BP (!) 143/84   Pulse (!) 105   Temp 99.2 F (37.3 C) (Oral)   Resp 15   Ht 4\' 11"  (1.499 m)   Wt 71.2 kg   LMP 12/28/2019   SpO2 100%   BMI 31.71 kg/m  EFM: baseline 120 bpm/ min variability/no accels/no decels   Toco: 2-3, adequate MVU's SVE: Dilation: Lip/rim Effacement (%): 90 Station: 0 Presentation: Vertex Exam by:: Talitha Dicarlo   MSK: 1+ pitting edema bilaterally to knees   A/P: 30 y.o. G1P0 [redacted]w[redacted]d  1. IOL: pit @1700 . SROM @2315 . Pit now running at 14. Adequate MVU's.   2. FWB: Cat II  3. Pain: epidural 4. A1GDM: q4 BGL checks in latent labor, q2 in active labor. BGL stable. 5. PEC w SF: BP remains normotensive to MR. Repeat labs demonstrate stable LFT's, rising creatinine (1.19) along with minimal UOP (<30cc/hr). On mag, repeat mag level pending.    [redacted]w[redacted]d, MD 12:23 PM

## 2020-09-28 NOTE — Progress Notes (Signed)
CBG's not populating in chart. 1/18 2053 79 1/19 0042 75

## 2020-09-28 NOTE — Progress Notes (Signed)
CRITICAL VALUE ALERT  Critical Value:  Magnesium 8.9  Date & Time Notied:  23:19 09/28/20  Provider Notified: Mariel Aloe  Orders Received/Actions taken:  Hold Magnesium sulfate Order mag level in 4 hours at 0400 09/29/20

## 2020-09-28 NOTE — Discharge Summary (Signed)
Postpartum Discharge Summary  Date of Service updated 10/01/2020     Patient Name: Patricia Delgado DOB: August 07, 1991 MRN: [id number]  Date of admission: 09/27/2020 Delivery date:09/28/2020  Delivering provider: Griffin Basil  Date of discharge: 10/01/2020  Admitting diagnosis: Encounter for induction of labor [Z34.90] Intrauterine pregnancy: [redacted]w[redacted]d    Secondary diagnosis:  Principal Problem:   Severe pre-eclampsia, delivered Active Problems:   Gestational diabetes   Gestational hypertension   Cesarean delivery delivered   Postoperative anemia due to acute blood loss  Additional problems: NA    Discharge diagnosis: Term Pregnancy Delivered, Preeclampsia (severe) and GDM A1                                              Post partum procedures:Magnesium x 24 hrs and IV iron Augmentation: AROM, Pitocin, Cytotec and IP Foley Complications: None  Hospital course: Induction of Labor With Cesarean Section   30y.o. yo G1P0 at 387w2das admitted to the hospital 09/27/2020 for induction of labor. Patient had a labor course significant for being admitted for gestational HTN, however on arrival had headache and abnormal lab fingins (elevated AST/ALT, low platelets). She was diagnosed with PEC w SF and was started on magnesium. The patient went for cesarean section due to Arrest of Dilation. Delivery details are as follows: Membrane Rupture Time/Date: 11:15 PM ,09/27/2020   Delivery Method:C-Section, Low Transverse  Details of operation can be found in separate operative Note.  Patient received Magnesium for 24 hrs and IV infusion. She was started on Procardia with good response. She is ambulating, tolerating a regular diet, passing flatus, and urinating well.  Patient is discharged home in stable condition on 10/01/20.      Newborn Data: Birth date:09/28/2020  Birth time:7:45 PM  Gender:Female  Living status:Living  Apgars:8 ,9  Weight:3416 g                                 Magnesium  Sulfate received: Yes: Seizure prophylaxis BMZ received: No Rhophylac:N/A MMR:N/A T-DaP:Given prenatally Flu: Yes Transfusion:No  Physical exam  Vitals:   09/30/20 1944 09/30/20 2344 10/01/20 0508 10/01/20 0757  BP: (!) 137/98 136/85 (!) 144/91 140/90  Pulse: 85 77 80 76  Resp: '20 18 20 18  ' Temp: 97.8 F (36.6 C) 98.1 F (36.7 C) 98 F (36.7 C) 97.8 F (36.6 C)  TempSrc: Oral Oral Oral Oral  SpO2: 100% 100% 98% 99%  Weight:      Height:       General: alert Lochia: appropriate Uterine Fundus: firm Incision: Healing well with no significant drainage DVT Evaluation: No evidence of DVT seen on physical exam. Labs: Lab Results  Component Value Date   WBC 14.2 (H) 10/01/2020   HGB 8.9 (L) 10/01/2020   HCT 25.8 (L) 10/01/2020   MCV 90.5 10/01/2020   PLT 179 10/01/2020   CMP Latest Ref Rng & Units 10/01/2020  Glucose 70 - 99 mg/dL 78  BUN 6 - 20 mg/dL 10  Creatinine 0.44 - 1.00 mg/dL 0.81  Sodium 135 - 145 mmol/L 140  Potassium 3.5 - 5.1 mmol/L 4.5  Chloride 98 - 111 mmol/L 108  CO2 22 - 32 mmol/L 24  Calcium 8.9 - 10.3 mg/dL 8.2(L)  Total Protein 6.5 - 8.1 g/dL 4.7(L)  Total Bilirubin 0.3 - 1.2 mg/dL 0.4  Alkaline Phos 38 - 126 U/L 104  AST 15 - 41 U/L 39  ALT 0 - 44 U/L 34   Edinburgh Score: Edinburgh Postnatal Depression Scale Screening Tool 09/29/2020  I have been able to laugh and see the funny side of things. 0  I have looked forward with enjoyment to things. 0  I have blamed myself unnecessarily when things went wrong. 3  I have been anxious or worried for no good reason. 0  I have felt scared or panicky for no good reason. 1  Things have been getting on top of me. 0  I have been so unhappy that I have had difficulty sleeping. 0  I have felt sad or miserable. 1  I have been so unhappy that I have been crying. 0  The thought of harming myself has occurred to me. 0  Edinburgh Postnatal Depression Scale Total 5     After visit meds:  Allergies as of  10/01/2020   No Known Allergies     Medication List    STOP taking these medications   glucose blood test strip   onetouch ultrasoft lancets     TAKE these medications   aspirin 81 MG chewable tablet Chew 1 tablet (81 mg total) by mouth daily.   Comfort Fit Maternity Supp Med Misc 1 Device by Does not apply route daily.   ferrous sulfate 325 (65 FE) MG tablet Take 1 tablet (325 mg total) by mouth every other day.   ibuprofen 600 MG tablet Commonly known as: ADVIL Take 1 tablet (600 mg total) by mouth every 6 (six) hours as needed for headache, moderate pain or cramping.   NIFEdipine 30 MG 24 hr tablet Commonly known as: ADALAT CC Take 1 tablet (30 mg total) by mouth daily.   ONE TOUCH ULTRA 2 w/Device Kit 4 (four) times daily.   oxyCODONE 5 MG immediate release tablet Commonly known as: Oxy IR/ROXICODONE Take 1-2 tablets (5-10 mg total) by mouth every 4 (four) hours as needed for moderate pain.   prenatal multivitamin Tabs tablet Take 1 tablet by mouth daily at 12 noon.   senna-docusate 8.6-50 MG tablet Commonly known as: Senokot-S Take 2 tablets by mouth at bedtime as needed for mild constipation or moderate constipation.        Discharge home in stable condition Infant Feeding: Bottle Infant Disposition:home with mother Discharge instruction: per After Visit Summary and Postpartum booklet. Activity: Advance as tolerated. Pelvic rest for 6 weeks.  Diet: routine diet Future Appointments: Future Appointments  Date Time Provider [place]  10/07/2020  9:45 AM Truett Mainland, DO [facility] None  11/10/2020  8:45 AM Nehemiah Settle, Tanna Savoy, DO [facility] None   Follow up Visit:  [place]. Schedule an appointment as soon as possible for a visit.   Specialty: Obstetrics and Gynecology Why: 1 week for BP and incision check 4 weeks for PP visit Contact information: [place] [phone] [phone]               Please schedule this patient for a In person postpartum visit in 6 weeks with the following provider: MD. Additional Postpartum F/U:2 hour GTT at six weeks, Incision check 1 week and BP check 1 week  High risk pregnancy complicated by: GDM and PEC w SF, AKI  Delivery mode:  C-Section, Low Transverse  Anticipated Birth Control:  Unsure   10/01/2020 Chancy Milroy, MD

## 2020-09-28 NOTE — Progress Notes (Addendum)
Labor Progress Note Amiyah Shryock is a 30 y.o. G1P0 at [redacted]w[redacted]d presented for IOL-HELLP.  S: Called to bedside for prolonged decel  O:  BP 104/71   Pulse (!) 102   Temp 98.1 F (36.7 C) (Axillary)   Resp 16   Ht 4\' 11"  (1.499 m)   Wt 71.2 kg   LMP 12/28/2019   SpO2 100%   BMI 31.71 kg/m  EFM: baseline 125 bpm/ mod variability/ + accels/late decels with contractions Toco: 2-3 SVE: Dilation: 6 Effacement (%): 90 Station: -1 Presentation: Vertex Exam by:: firestone  A/P: 30 y.o. G1P0 [redacted]w[redacted]d  1. IOL: pit @1700 . SROM @2315 . Prolonged decel, now improved with position changes and at baseline. FSE in place for better tracing. Pitocin had reached 20 miliunits/min, now held after decel. Will restart in 1 hour. 2. FWB: Cat II, overall reassuring 3. Pain: epidural 4. A1GDM: q4 BGL checks in latent labor, q2 in active labor. BGL stable. 5. HELLP: elevated BP in clinic and on admission. Asymptomatic. Mg. No anti-htn meds currently. UOP decreased, repeating CBC, CMP, and getting magnesium level.   [redacted]w[redacted]d, MD 3:36 AM

## 2020-09-28 NOTE — Progress Notes (Signed)
Labor Progress Note Patricia Delgado is a 30 y.o. G1P0 at [redacted]w[redacted]d presented for IOL-HELLP.  S: Very sleepy   O:  BP (!) 141/98   Pulse (!) 107   Temp 98.5 F (36.9 C) (Oral)   Resp 15   Ht 4\' 11"  (1.499 m)   Wt 71.2 kg   LMP 12/28/2019   SpO2 100%   BMI 31.71 kg/m  EFM: baseline 120 bpm/ min variability/no accels/no decels   Toco: 2-3, adequate MVU's SVE: Dilation: 10 Dilation Complete Date: 09/28/20 Dilation Complete Time: 1420 Effacement (%): 90 Station: 0 Presentation: Vertex Exam by:: Chadric Kimberley   MSK: 1+ pitting edema bilaterally to knees   A/P: 30 y.o. G1P0 [redacted]w[redacted]d  1. IOL: pit @1700 . SROM @2315 . Progressed to complete but stayed at 0 station despite 2.5 hours of laboring down. Subsequently pushed for 90 minutes without any descent and had increasing caput. At this time have discussed status with patient, patient's family, and OB team and recommend cesarean for arrest of descent.   The risks of surgery were discussed with the patient including but were not limited to: bleeding which may require transfusion or reoperation; infection which may require antibiotics; injury to bowel, bladder, ureters or other surrounding organs; injury to the fetus; need for additional procedures including hysterectomy in the event of a life-threatening hemorrhage; formation of adhesions; placental abnormalities wth subsequent pregnancies; incisional problems; thromboembolic phenomenon and other postoperative/anesthesia complications.  The patient concurred with the proposed plan, giving informed written consent for the procedure.   Patient will remain NPO for procedure. Anesthesia and OR aware. Preoperative prophylactic antibiotics and SCDs ordered on call to the OR.  To OR when ready.  2. FWB: Cat II  3. Pain: epidural 4. A1GDM: q4 BGL checks in latent labor, q2 in active labor. BGL stable. 5. PEC w SF: BP remains normotensive to MR. Repeat labs demonstrate stable LFT's, rising creatinine (1.19) along  with minimal UOP (<30cc/hr). On mag, repeat mag level pending.    [redacted]w[redacted]d, MD 5:18 PM

## 2020-09-28 NOTE — Progress Notes (Signed)
Pt has made no descent with one hour second stage.  Pt states "she has no energy". Dr Myriam Jacobson at bedside to discuss POC for primary C/S.  Answered questions for patient, FOB and mother of patient.  Pitocin off.

## 2020-09-28 NOTE — Transfer of Care (Signed)
Immediate Anesthesia Transfer of Care Note  Patient: Patricia Delgado  Procedure(s) Performed: CESAREAN SECTION  Patient Location: PACU  Anesthesia Type:Epidural  Level of Consciousness: awake, alert  and oriented  Airway & Oxygen Therapy: Patient Spontanous Breathing  Post-op Assessment: Report given to RN and Post -op Vital signs reviewed and stable  Post vital signs: Reviewed and stable  Last Vitals:  Vitals Value Taken Time  BP 139/88 09/28/20 2030  Temp    Pulse 92 09/28/20 2031  Resp 19 09/28/20 2031  SpO2 100 % 09/28/20 2031  Vitals shown include unvalidated device data.  Last Pain:  Vitals:   09/28/20 1900  TempSrc: Oral  PainSc:          Complications: No complications documented.

## 2020-09-28 NOTE — Op Note (Signed)
Operative Note   SURGERY DATE: 09/28/2020  PRE-OP DIAGNOSIS:  *Pregnancy at [redacted]w[redacted]d * Arrest of Descent * PEC w SF   POST-OP DIAGNOSIS: same    PROCEDURE: primary low transverse cesarean section via pfannenstiel skin incision with double layer uterine closure  SURGEON: Surgeon(s) and Role:    Warden Fillers, MD - Primary    * Carah Barrientes, Arlana Pouch, MD - Fellow  ANESTHESIA: epidural  ESTIMATED BLOOD LOSS: 790  DRAINS: 66mL UOP via indwelling foley  TOTAL IV FLUIDS: crystalloid  VTE PROPHYLAXIS: SCDs to bilateral lower extremities  ANTIBIOTICS: Two grams of Cefazolin, 500mg  Azithromycin were given., within 1 hour of skin incision  SPECIMENS: placenta  COMPLICATIONS: none   INDICATIONS: Arrest of Descent  FINDINGS: No intra-abdominal adhesions were noted. Grossly normal uterus, tubes and ovaries. Clear amniotic fluid, cephalic female infant, weight pending, APGARs 8/9, intact placenta.  PROCEDURE IN DETAIL: The patient was taken to the operating room where anesthesia was administered and normal fetal heart tones were confirmed. She was then prepped and draped in the normal fashion in the dorsal supine position with a leftward tilt.  After a time out was performed, a pfannensteil skin incision was made with the scalpel and carried through to the underlying layer of fascia. The fascia was then incised at the midline and this incision was extended laterally with the mayo scissors. Attention was turned to the superior aspect of the fascial incision which was grasped with the kocher clamps x 2, tented up and the rectus muscles were dissected off with the mayo scissors. In a similar fashion the inferior aspect of the fascial incision was grasped with the kocher clamps, tented up and the rectus muscles dissected off with the mayo scissors. The rectus muscles were then separated in the midline and the peritoneum was entered bluntly. The Alexis retractor was inserted and the vesicouterine  peritoneum was identified, tented up and entered with the metzenbaum scissors. This incision was extended laterally and the bladder flap was created digitally.   A low transverse hysterotomy was made with the scalpel until the endometrial cavity was breached and the amniotic sac ruptured, yielding clear amniotic fluid. This incision was extended bluntly and the infant's head, shoulders and body were delivered atraumatically.The cord was clamped x 2 and cut, and the infant was handed to the awaiting pediatricians, after delayed cord clamping was done.  The placenta was then gradually expressed from the uterus and then the uterus was exteriorized and cleared of all clots and debris. The hysterotomy was repaired with a running suture of 1-0 monocryl. A second imbricating layer of 1-0 monocryl suture was then placed. Several figure-of-eight sutures of monocryl were added to achieve excellent hemostasis.    The uterus and adnexa were then returned to the abdomen, and the hysterotomy and all operative sites were reinspected and excellent hemostasis was noted after irrigation and suction of the abdomen with warm saline.  The peritoneum was closed with a running stitch of 2-0 Vicryl. The fascia was reapproximated with 0 Vicryl in a simple running fashion bilaterally. The skin was then closed with 4-0 vicryl, in a subcuticular fashion.  The patient  tolerated the procedure well. Sponge, lap, needle, and instrument counts were correct x 2. The patient was transferred to the recovery room awake, alert and breathing independently in stable condition.   10/9, MD [facility] Fellow, [facility] for [facility], [facility]

## 2020-09-28 NOTE — Progress Notes (Signed)
Patient ID: Patricia Delgado, female   DOB: 08/27/1991, 30 y.o.   MRN: [id number]  Patient seen - comfortable with epidural.   BP 116/71   Pulse (!) 101   Temp 99.2 F (37.3 C) (Oral)   Resp 15   Ht 4\' 11"  (1.499 m)   Wt 71.2 kg   LMP 12/28/2019   SpO2 100%   BMI 31.71 kg/m   Dilation: 6 Effacement (%): 90 Station: -1 Presentation: Vertex Exam by:: [email or web address], RN  Not reexamined as not having adequate contractions at this point.  Labor course reviewed. Reviewed FHT. Current status reviewed with nurse - having slightly heavier bleeding than just bloody show. May be having marginal abruption  Baseline 110-120. Moderate variability currently with an accel. No decels. Did have episodes of minimal variability - likely due to magnesium.  Will continue to titrate pitocin. Will continue to watch closely.  Henderson Newcomer, DO 09/28/2020 9:12 AM

## 2020-09-28 NOTE — Anesthesia Postprocedure Evaluation (Signed)
Anesthesia Post Note  Patient: Patricia Delgado  Procedure(s) Performed: CESAREAN SECTION     Patient location during evaluation: PACU Anesthesia Type: Epidural Level of consciousness: oriented and awake and alert Pain management: pain level controlled Vital Signs Assessment: post-procedure vital signs reviewed and stable Respiratory status: spontaneous breathing, respiratory function stable and nonlabored ventilation Cardiovascular status: blood pressure returned to baseline and stable Postop Assessment: no headache, no backache, no apparent nausea or vomiting, epidural receding and patient able to bend at knees Anesthetic complications: no   No complications documented.  Last Vitals:  Vitals:   09/28/20 2146 09/28/20 2200  BP: (!) 163/99   Pulse: 95   Resp: 19   Temp:    SpO2: 100% 98%    Last Pain:  Vitals:   09/28/20 2030  TempSrc: Oral  PainSc: 0-No pain   Pain Goal:                   Ethanjames Fontenot A.

## 2020-09-28 NOTE — Progress Notes (Signed)
Called to bedside by RN Judeth Cornfield with concern for [facility]. Also concerned with firm abdomen and bleeding with cervical exam, more than bloody show and concern for possible placental abruption. FHT reviewed, min-mod variability, baseline stable from prior. No accels or decels. VSS.  Cervical exam unchanged from prior, pitocin previously off at 0315, restarted at 0500 at 18mL/hr. Good fetal scalp stim with my exam. FSE already in place. IUPC placed without difficulty, bloody show noted on exam. No further bleeding or passage of clots on my exam. Abdominal exam unremarkable. Discussed findings with RN and patient. Will continue to monitor FHT and repeat preE labs/Mg level given decreased UOP.   Alric Seton, MD OB Fellow, Faculty [facility], Center for [facility] 09/28/2020 5:17 AM

## 2020-09-29 ENCOUNTER — Encounter (HOSPITAL_COMMUNITY): Payer: Self-pay | Admitting: Obstetrics and Gynecology

## 2020-09-29 LAB — CBC
HCT: 28.9 % — ABNORMAL LOW (ref 36.0–46.0)
HCT: 25.6 % — ABNORMAL LOW (ref 36.0–46.0)
Hemoglobin: 9.6 g/dL — ABNORMAL LOW (ref 12.0–15.0)
Hemoglobin: 9 g/dL — ABNORMAL LOW (ref 12.0–15.0)
MCH: 30.2 pg (ref 26.0–34.0)
MCH: 31.6 pg (ref 26.0–34.0)
MCHC: 33.2 g/dL (ref 30.0–36.0)
MCHC: 35.2 g/dL (ref 30.0–36.0)
MCV: 90.9 fL (ref 80.0–100.0)
MCV: 89.8 fL (ref 80.0–100.0)
Platelets: 147 10*3/uL — ABNORMAL LOW (ref 150–400)
Platelets: 138 10*3/uL — ABNORMAL LOW (ref 150–400)
RBC: 3.18 MIL/uL — ABNORMAL LOW (ref 3.87–5.11)
RBC: 2.85 MIL/uL — ABNORMAL LOW (ref 3.87–5.11)
RDW: 14.7 % (ref 11.5–15.5)
RDW: 15 % (ref 11.5–15.5)
WBC: 23.4 10*3/uL — ABNORMAL HIGH (ref 4.0–10.5)
WBC: 20.8 10*3/uL — ABNORMAL HIGH (ref 4.0–10.5)
nRBC: 0 % (ref 0.0–0.2)
nRBC: 0 % (ref 0.0–0.2)

## 2020-09-29 LAB — COMPREHENSIVE METABOLIC PANEL
ALT: 29 U/L (ref 0–44)
AST: 24 U/L (ref 15–41)
Albumin: 1.7 g/dL — ABNORMAL LOW (ref 3.5–5.0)
Alkaline Phosphatase: 98 U/L (ref 38–126)
Anion gap: 9 (ref 5–15)
BUN: 14 mg/dL (ref 6–20)
CO2: 20 mmol/L — ABNORMAL LOW (ref 22–32)
Calcium: 7.6 mg/dL — ABNORMAL LOW (ref 8.9–10.3)
Chloride: 101 mmol/L (ref 98–111)
Creatinine, Ser: 1.44 mg/dL — ABNORMAL HIGH (ref 0.44–1.00)
GFR, Estimated: 50 mL/min — ABNORMAL LOW (ref >60)
Glucose, Bld: 102 mg/dL — ABNORMAL HIGH (ref 70–99)
Potassium: 4.1 mmol/L (ref 3.5–5.1)
Sodium: 130 mmol/L — ABNORMAL LOW (ref 135–145)
Total Bilirubin: 1.1 mg/dL (ref 0.3–1.2)
Total Protein: 4.2 g/dL — ABNORMAL LOW (ref 6.5–8.1)

## 2020-09-29 LAB — MAGNESIUM
Magnesium: 5.8 mg/dL — ABNORMAL HIGH (ref 1.7–2.4)
Magnesium: 5 mg/dL — ABNORMAL HIGH (ref 1.7–2.4)

## 2020-09-29 LAB — GLUCOSE, CAPILLARY: Glucose-Capillary: 124 mg/dL — ABNORMAL HIGH (ref 70–99)

## 2020-09-29 MED ORDER — DIPHENHYDRAMINE HCL 25 MG PO CAPS: 25.0000 mg | ORAL_CAPSULE | Freq: Four times a day (QID) | ORAL | Status: DC | PRN

## 2020-09-29 MED ORDER — SENNOSIDES-DOCUSATE SODIUM 8.6-50 MG PO TABS
2.0000 | ORAL_TABLET | Freq: Every evening | ORAL | Status: DC | PRN
  Administered 2020-09-29: 2 via ORAL
  Filled 2020-09-29: qty 2

## 2020-09-29 MED ORDER — POLYETHYLENE GLYCOL 3350 17 G PO PACK
17.0000 g | PACK | Freq: Every day | ORAL | Status: DC
  Administered 2020-09-29 – 2020-09-30 (×2): 17 g via ORAL
  Filled 2020-09-29 (×2): qty 1

## 2020-09-29 NOTE — Lactation Note (Signed)
This note was copied from a baby's chart. Lactation Consultation Note  Patient Name: Patricia Delgado [id number] Date: 09/29/2020 Reason for consult: Follow-up assessment Age:30 hours  Follow-up visit to 30 hours old infant with 1.08% weight loss at the time of consult. Mother is attempting to latch infant to right breast, football position. Infant is holding nipple in mouth, no sucking. LC offered assistance with latch. Undressed infant and set up additional support pillows for football position to right breast. Demonstrated alignment and hand expression. Colostrum is easily expressed. Several attempts made but unable to latch infant.   Reviewed hand expression and able to collect ~62mL of EBM. Mother and grandmother spoon-fed infant with LC assistance. Infant had a stool and grandmother changed diaper. Attempted latch to right breast, now cradle position. Latched infant after a few attempts. Noted suckling and swallowing. Mother denies discomfort or pain.   Educated mom about deep latch for an optimal breastfeeding session.  Plan:   1-Breastfeeding on demand, ensuring a deep, comfortable latch.  2-Undressing infant and place skin to skin when ready to breastfeed 3-Keep infant awake during breastfeeding session: massaging breast, infant's hand/shoulder/feet 4-Monitor voids and stools as signs good intake.  5-Promoted maternal care as adequate hydration, nutrition and rest.  6-Contact LC as needed for feeds/support/concerns/questions   Maternal Data Formula Feeding for Exclusion: No Has patient been taught Hand Expression?: Yes Does the patient have breastfeeding experience prior to this delivery?: No  Feeding Feeding Type: Breast Fed  LATCH Score Latch: Grasps breast easily, tongue down, lips flanged, rhythmical sucking.  Audible Swallowing: Spontaneous and intermittent  Type of Nipple: Everted at rest and after stimulation  Comfort (Breast/Nipple): Soft / non-tender  Hold  (Positioning): Assistance needed to correctly position infant at breast and maintain latch.  LATCH Score: 9  Interventions Interventions: Breast feeding basics reviewed;Assisted with latch;Skin to skin;Breast massage;Hand express;Adjust position;Expressed milk;Position options;Support pillows  Lactation Tools Discussed/Used WIC Program: No   Consult Status Consult Status: Follow-up Date: 09/30/20 Follow-up type: In-patient    Kenlie Seki A Higuera Ancidey 09/29/2020, 4:12 PM

## 2020-09-29 NOTE — Lactation Note (Signed)
This note was copied from a baby's chart. Lactation Consultation Note  Patient Name: Patricia Delgado [id number] Date: 09/29/2020 Reason for consult: Initial assessment;Term Age:30 years  Mom's hx: C/S, GHTN, AI-GDM-diet controlled, Pupps.  P1, term female infant was in [address] due to hypothermia. Infant was given 10 mls of formula in Central Nursery at 2335 pm. Infant was return to [facility] speciality Care while LC was in the room,  LC unable to see latch due infant recently being formula fed and  infant not cuing to BF.  Mom plans to use her own DEBP a Spectra 2 that she brought from home, mom knows to use DEBP every 3 hours for 15 minutes on initial setting.  LC discussed hand expression and mom expressed 5 mls of colostrum in bullet. Mom will offer colostrum after latching infant at the breast for the next feeding. Mom knows to BF infant according to cues, 8 to 12+ times within 24 hours, STS. LC discussed if infant doesn't latch, to hand express  or  Use DEBP and give by volume by spoon. LC discussed infant's input and output with parents. Mom knows to call RN or LC if she has further questions, concerns or needs assistance with latching infant at the breast. Mom made aware of O/P services, breastfeeding support groups, community resources, and our phone # for post-discharge questions.  Maternal Data Formula Feeding for Exclusion: No Has patient been taught Hand Expression?: Yes Does the patient have breastfeeding experience prior to this delivery?: No  Feeding Feeding Type: Formula Nipple Type: Slow - flow  LATCH Score             Interventions Interventions: Breast feeding basics reviewed;Skin to skin;Hand express;Expressed milk  Lactation Tools Discussed/Used WIC Program: No   Consult Status Consult Status: Follow-up Date: 09/29/20 Follow-up type: In-patient    Danelle Earthly 09/29/2020, 12:22 AM

## 2020-09-29 NOTE — Plan of Care (Signed)
  Problem: Education: Goal: Knowledge of Childbirth will improve Outcome: Completed/Met Goal: Ability to make informed decisions regarding treatment and plan of care will improve Outcome: Completed/Met Goal: Ability to state and carry out methods to decrease the pain will improve Outcome: Completed/Met Goal: Individualized Educational Video(s) Outcome: Completed/Met   Problem: Coping: Goal: Ability to verbalize concerns and feelings about labor and delivery will improve Outcome: Completed/Met   Problem: Life Cycle: Goal: Ability to make normal progression through stages of labor will improve Outcome: Completed/Met   Problem: Role Relationship: Goal: Will demonstrate positive interactions with the child Outcome: Completed/Met   Problem: Safety: Goal: Risk of complications during labor and delivery will decrease Outcome: Completed/Met   Problem: Pain Management: Goal: Relief or control of pain from uterine contractions will improve Outcome: Completed/Met   Problem: Education: Goal: Knowledge of General Education information will improve Description: Including pain rating scale, medication(s)/side effects and non-pharmacologic comfort measures Outcome: Completed/Met   

## 2020-09-30 ENCOUNTER — Other Ambulatory Visit (HOSPITAL_COMMUNITY): Payer: Self-pay | Admitting: Obstetrics & Gynecology

## 2020-09-30 DIAGNOSIS — D62 Acute posthemorrhagic anemia: Secondary | ICD-10-CM | POA: Diagnosis not present

## 2020-09-30 LAB — COMPREHENSIVE METABOLIC PANEL
ALT: 24 U/L (ref 0–44)
AST: 22 U/L (ref 15–41)
Albumin: 1.8 g/dL — ABNORMAL LOW (ref 3.5–5.0)
Alkaline Phosphatase: 98 U/L (ref 38–126)
Anion gap: 9 (ref 5–15)
BUN: 14 mg/dL (ref 6–20)
CO2: 21 mmol/L — ABNORMAL LOW (ref 22–32)
Calcium: 8.1 mg/dL — ABNORMAL LOW (ref 8.9–10.3)
Chloride: 102 mmol/L (ref 98–111)
Creatinine, Ser: 1.01 mg/dL — ABNORMAL HIGH (ref 0.44–1.00)
GFR, Estimated: >60 mL/min (ref >60)
Glucose, Bld: 75 mg/dL (ref 70–99)
Potassium: 4.2 mmol/L (ref 3.5–5.1)
Sodium: 132 mmol/L — ABNORMAL LOW (ref 135–145)
Total Bilirubin: 0.7 mg/dL (ref 0.3–1.2)
Total Protein: 4.4 g/dL — ABNORMAL LOW (ref 6.5–8.1)

## 2020-09-30 LAB — CBC
HCT: 26 % — ABNORMAL LOW (ref 36.0–46.0)
Hemoglobin: 9 g/dL — ABNORMAL LOW (ref 12.0–15.0)
MCH: 31.4 pg (ref 26.0–34.0)
MCHC: 34.6 g/dL (ref 30.0–36.0)
MCV: 90.6 fL (ref 80.0–100.0)
Platelets: 131 10*3/uL — ABNORMAL LOW (ref 150–400)
RBC: 2.87 MIL/uL — ABNORMAL LOW (ref 3.87–5.11)
RDW: 15.2 % (ref 11.5–15.5)
WBC: 17.3 10*3/uL — ABNORMAL HIGH (ref 4.0–10.5)
nRBC: 0 % (ref 0.0–0.2)

## 2020-09-30 MED ORDER — SODIUM CHLORIDE 0.9 % IV SOLN
500.0000 mg | Freq: Once | INTRAVENOUS | Status: AC
  Administered 2020-09-30: 500 mg via INTRAVENOUS
  Filled 2020-09-30: qty 25

## 2020-09-30 MED ORDER — SENNOSIDES-DOCUSATE SODIUM 8.6-50 MG PO TABS: 2.0000 | ORAL_TABLET | Freq: Every evening | ORAL | 0 refills | Status: DC | PRN

## 2020-09-30 MED ORDER — IBUPROFEN 600 MG PO TABS: 600.0000 mg | ORAL_TABLET | Freq: Four times a day (QID) | ORAL | 2 refills | Status: AC | PRN

## 2020-09-30 MED ORDER — NIFEDIPINE ER 30 MG PO TB24: 30.0000 mg | ORAL_TABLET | Freq: Every day | ORAL | 1 refills | Status: AC

## 2020-09-30 MED ORDER — OXYCODONE HCL 5 MG PO TABS: 5.0000 mg | ORAL_TABLET | ORAL | 0 refills | Status: DC | PRN

## 2020-09-30 MED ORDER — FERROUS SULFATE 325 (65 FE) MG PO TABS
325.0000 mg | ORAL_TABLET | ORAL | Status: DC
  Administered 2020-09-30 – 2020-10-01 (×2): 325 mg via ORAL
  Filled 2020-09-30 (×2): qty 1

## 2020-09-30 MED ORDER — FERROUS SULFATE 325 (65 FE) MG PO TABS: 325.0000 mg | ORAL_TABLET | ORAL | 0 refills | Status: DC

## 2020-09-30 MED ORDER — NIFEDIPINE ER OSMOTIC RELEASE 30 MG PO TB24
30.0000 mg | ORAL_TABLET | Freq: Every day | ORAL | Status: DC
  Administered 2020-09-30 – 2020-10-01 (×2): 30 mg via ORAL
  Filled 2020-09-30 (×2): qty 1

## 2020-09-30 MED FILL — IBUPROFEN 600 MG TABLET: 600 | 7 days supply | Qty: 30 | Fill #0

## 2020-09-30 MED FILL — NIFEdipine ER 30 MG TB24: 30 | 30 days supply | Qty: 30 | Fill #0

## 2020-09-30 MED FILL — FERROUS SULFATE 325 MG TAB: 325 (65 FE) | 60 days supply | Qty: 30 | Fill #0

## 2020-09-30 MED FILL — oxyCODONE HCL 5 MG TABS: 5 | 5 days supply | Qty: 30 | Fill #0

## 2020-09-30 MED FILL — SENEXON-S 8.6-50 MG TABS: 8.6-50 | 15 days supply | Qty: 30 | Fill #0

## 2020-09-30 NOTE — Progress Notes (Signed)
Postpartum Day 2: Cesarean Delivery at [redacted]w[redacted]d for arrest of descent in the setting of severe preeclampsia  Subjective: Patient reports incisional pain, tolerating PO and no problems voiding.  No flatus yet. Patient denies any headaches, visual symptoms, RUQ/epigastric pain or other concerning symptoms. Baby doing well in room with patient.   Objective: Vital signs: Patient Vitals for the past 24 hrs:  BP Temp Temp src Pulse Resp SpO2  09/30/20 0442 (!) 142/79 98.2 F (36.8 C) Oral 88 18 98 %  09/30/20 0005 127/77 98.4 F (36.9 C) Oral 77 18 98 %  09/29/20 1913 115/71 98.1 F (36.7 C) Oral 75 16 98 %  09/29/20 1210 105/74 97.7 F (36.5 C) Oral 83 16 97 %    Physical Exam:  General: alert and no distress Lochia: appropriate Uterine Fundus: firm, NT  Incision: dressing in place DVT Evaluation: No evidence of DVT seen on physical exam. Negative Homan's sign.  Labs: CBC Latest Ref Rng & Units 09/30/2020 09/29/2020 09/29/2020  WBC 4.0 - 10.5 K/uL 17.3(H) 20.8(H) 23.4(H)  Hemoglobin 12.0 - 15.0 g/dL 9.0(L) 9.0(L) 9.6(L)  Hematocrit 36.0 - 46.0 % 26.0(L) 25.6(L) 28.9(L)  Platelets 150 - 400 K/uL 131(L) 138(L) 147(L)   CMP Latest Ref Rng & Units 09/29/2020 09/28/2020 09/28/2020  Glucose 70 - 99 mg/dL 948(N) 462(V) 96  BUN 6 - 20 mg/dL 14 8 8   Creatinine 0.44 - 1.00 mg/dL ) 0.35(K) 0.93(G)  Sodium 135 - 145 mmol/L 130(L) 128(L) 128(L)  Potassium 3.5 - 5.1 mmol/L 4.1 4.1 3.8  Chloride 98 - 111 mmol/L 101 100 100  CO2 22 - 32 mmol/L 20(L) 15(L) 14(L)  Calcium 8.9 - 10.3 mg/dL 7.6(L) 8.0(L) 8.3(L)  Total Protein 6.5 - 8.1 g/dL 4.2(L) 5.3(L) 5.8(L)  Total Bilirubin 0.3 - 1.2 mg/dL 1.1 1.2 1.82(X)  Alkaline Phos 38 - 126 U/L 98 141(H) 163(H)  AST 15 - 41 U/L 24 39 43(H)  ALT 0 - 44 U/L 29 48(H) 58(H)    Assessment/Plan: Status post Cesarean section, has severe preeclampsia, asymptomatic postoperative anemia.  Will start on Procardia XL 30 mg daily Will follow up labs Venofer  recommended for asymptomatic anemia, she was counseled about this and agreed. This was ordered. Encouraged OOB, regular diet Unsure of contraception at this point Likely discharge tomorrow, Meds-to-Beds consult placed and hopefully she will her discharge medications today. Continue routine postpartum care.    9.3(Z, MD, FACOG Obstetrician & Gynecologist, [facility] for [facility]

## 2020-09-30 NOTE — Progress Notes (Signed)
Postpartum Day 1: Cesarean Delivery at [redacted]w[redacted]d for arrest of descent in the setting of severe preeclampsia  Subjective: Patient reports incisional pain, tolerating PO and no problems voiding.  No flatus yet. Patient denies any headaches, visual symptoms, RUQ/epigastric pain or other concerning symptoms. Baby doing well in room with patient. Of note, patient's magnesium sulfate was turned off earlier due to elevated levels in the setting of increased Cr. Good UOP.  Objective: Vital signs: 09/29/20 1210 105/74 97.7 F (36.5 C) Oral 83 16 97 %  09/29/20 0720 119/74 98.3 F (36.8 C) Oral 83 17 99 %    Physical Exam:  General: alert and no distress Lochia: appropriate Uterine Fundus: firm, NT  Incision: dressing in place DVT Evaluation: No evidence of DVT seen on physical exam. Negative Homan's sign.  Labs: CBC Latest Ref Rng & Units 09/29/2020 09/29/2020 09/28/2020  WBC 4.0 - 10.5 K/uL 20.8(H) 23.4(H) 21.9(H)  Hemoglobin 12.0 - 15.0 g/dL 9.0(L) 9.6(L) 13.0  Hematocrit 36.0 - 46.0 % 25.6(L) 28.9(L) 39.6  Platelets 150 - 400 K/uL 138(L) 147(L) 151   CMP Latest Ref Rng & Units 09/29/2020 09/28/2020 09/28/2020  Glucose 70 - 99 mg/dL 321(Y) 248(G) 96  BUN 6 - 20 mg/dL 14 8 8   Creatinine 0.44 - 1.00 mg/dL ) 5.00(B) 7.04(U)  Sodium 135 - 145 mmol/L 130(L) 128(L) 128(L)  Potassium 3.5 - 5.1 mmol/L 4.1 4.1 3.8  Chloride 98 - 111 mmol/L 101 100 100  CO2 22 - 32 mmol/L 20(L) 15(L) 14(L)  Calcium 8.9 - 10.3 mg/dL 7.6(L) 8.0(L) 8.3(L)  Total Protein 6.5 - 8.1 g/dL 4.2(L) 5.3(L) 5.8(L)  Total Bilirubin 0.3 - 1.2 mg/dL 1.1 1.2 8.89(V)  Alkaline Phos 38 - 126 U/L 98 141(H) 163(H)  AST 15 - 41 U/L 24 39 43(H)  ALT 0 - 44 U/L 29 48(H) 58(H)    Assessment/Plan: Status post Cesarean section. Postoperative course complicated by anemia, no symptoms.  Stable BP for now, will continue to monitor and ascertain need for medication Will recheck labs tomorrow; Cr trending down Iron therapy recommended  for anemia Encouraged OOB Unsure of contraception at this point Continue routine postpartum  care.    6.9(I, MD, FACOG Obstetrician & Gynecologist, [facility] for [facility]

## 2020-09-30 NOTE — Lactation Note (Addendum)
This note was copied from a baby's chart. Lactation Consultation Note  Patient Name: Girl Catharine Kettlewell CLEXN'T Date: 09/30/2020   Age:30 hours Baby girl Naavha asleep in crib on arrival.  Grandmother visiting. Mom reports she feels they have been breastfeeding well.  Mom has been hand expressing and doing some spoon feeding with colostrum.  Mom has initiated pumping with her personal Spectra double pump.  Mom has used her personal pump twice.  Mom reports she did not get anything with pumping.  Baby girl with 4.39 percent weight loss.  Infant with 4 voids and 4 stools.  Last output at 6am.  Praised moms efforts.  Urged her to keep doing what she is doing and maybe add more pumping.  Urged mom to pump after a breastfeed(try to get in 6-8 pumpings a day) close to the 15 minute mark past breastfeeding.  Even if she isn't getting anything.  Urged her to feed back any drops that she does get to infant. Infant with a slight increase in bili Mom with GDM during pregnancy. Urged mom to call lactation as needed..   Maternal Data    Feeding Feeding Type: Breast Fed  [facility] Score                   Interventions    Lactation Tools Discussed/Used     Consult Status      Iven Earnhart Michaelle Copas 09/30/2020, 12:14 PM

## 2020-10-01 ENCOUNTER — Other Ambulatory Visit (HOSPITAL_COMMUNITY): Payer: BC Managed Care – PPO

## 2020-10-01 LAB — COMPREHENSIVE METABOLIC PANEL
ALT: 34 U/L (ref 0–44)
AST: 39 U/L (ref 15–41)
Albumin: 1.8 g/dL — ABNORMAL LOW (ref 3.5–5.0)
Alkaline Phosphatase: 104 U/L (ref 38–126)
Anion gap: 8 (ref 5–15)
BUN: 10 mg/dL (ref 6–20)
CO2: 24 mmol/L (ref 22–32)
Calcium: 8.2 mg/dL — ABNORMAL LOW (ref 8.9–10.3)
Chloride: 108 mmol/L (ref 98–111)
Creatinine, Ser: 0.81 mg/dL (ref 0.44–1.00)
GFR, Estimated: >60 mL/min (ref >60)
Glucose, Bld: 78 mg/dL (ref 70–99)
Potassium: 4.5 mmol/L (ref 3.5–5.1)
Sodium: 140 mmol/L (ref 135–145)
Total Bilirubin: 0.4 mg/dL (ref 0.3–1.2)
Total Protein: 4.7 g/dL — ABNORMAL LOW (ref 6.5–8.1)

## 2020-10-01 LAB — CBC
HCT: 25.8 % — ABNORMAL LOW (ref 36.0–46.0)
Hemoglobin: 8.9 g/dL — ABNORMAL LOW (ref 12.0–15.0)
MCH: 31.2 pg (ref 26.0–34.0)
MCHC: 34.5 g/dL (ref 30.0–36.0)
MCV: 90.5 fL (ref 80.0–100.0)
Platelets: 179 10*3/uL (ref 150–400)
RBC: 2.85 MIL/uL — ABNORMAL LOW (ref 3.87–5.11)
RDW: 15.1 % (ref 11.5–15.5)
WBC: 14.2 10*3/uL — ABNORMAL HIGH (ref 4.0–10.5)
nRBC: 0 % (ref 0.0–0.2)

## 2020-10-01 NOTE — Lactation Note (Signed)
This note was copied from a baby's chart. Lactation Consultation Note  Patient Name: Patricia Delgado CYELY'H Date: 10/01/2020 Reason for consult: Follow-up assessment;Infant weight loss Age:30 hours   LC to room for f/u visit. Infant is at 9% wt loss this morning. Overall output is wnl but last stool was 24 hours ago. Infant is feeding often with cues. LC observed latch with audible swallowing during visit. LC suggested continuing to feed with cues and offer 5-67mLs of colostrum p bf today to encourage stooling and improve weight. Patient was provided with the opportunity to ask questions. All concerns were addressed.  Will plan follow up visit.    LATCH Score: 9   Consult Status Consult Status: Follow-up Follow-up type: In-patient   Elder Negus, MA IBCLC 10/01/2020, 10:37 AM

## 2020-10-01 NOTE — Plan of Care (Signed)
  Problem: Health Behavior/Discharge Planning: Goal: Ability to manage health-related needs will improve Outcome: Progressing   Problem: Clinical Measurements: Goal: Ability to maintain clinical measurements within normal limits will improve Outcome: Progressing Goal: Will remain free from infection Outcome: Progressing Goal: Diagnostic test results will improve Outcome: Progressing Goal: Respiratory complications will improve Outcome: Progressing Goal: Cardiovascular complication will be avoided Outcome: Progressing   Problem: Education: Goal: Knowledge of condition will improve Outcome: Progressing   Problem: Activity: Goal: Will verbalize the importance of balancing activity with adequate rest periods Outcome: Progressing Goal: Ability to tolerate increased activity will improve Outcome: Progressing   Problem: Coping: Goal: Ability to identify and utilize available resources and services will improve Outcome: Progressing   Problem: Life Cycle: Goal: Chance of risk for complications during the postpartum period will decrease Outcome: Progressing   Problem: Skin Integrity: Goal: Demonstration of wound healing without infection will improve Outcome: Progressing   Problem: Education: Goal: Individualized Educational Video(s) Outcome: Completed/Met Goal: Individualized Newborn Educational Video(s) Outcome: Completed/Met   Problem: Role Relationship: Goal: Ability to demonstrate positive interaction with newborn will improve Outcome: Completed/Met

## 2020-10-01 NOTE — Plan of Care (Signed)
  Problem: Health Behavior/Discharge Planning: Goal: Ability to manage health-related needs will improve 10/01/2020 1054 by Fernanda Drum, RN Outcome: Adequate for Discharge 10/01/2020 1018 by Fernanda Drum, RN Outcome: Progressing   Problem: Clinical Measurements: Goal: Ability to maintain clinical measurements within normal limits will improve 10/01/2020 1054 by Fernanda Drum, RN Outcome: Adequate for Discharge 10/01/2020 1018 by Fernanda Drum, RN Outcome: Progressing Goal: Will remain free from infection 10/01/2020 1054 by Fernanda Drum, RN Outcome: Adequate for Discharge 10/01/2020 1018 by Fernanda Drum, RN Outcome: Progressing Goal: Diagnostic test results will improve 10/01/2020 1054 by Fernanda Drum, RN Outcome: Adequate for Discharge 10/01/2020 1018 by Fernanda Drum, RN Outcome: Progressing Goal: Respiratory complications will improve 10/01/2020 1054 by Fernanda Drum, RN Outcome: Adequate for Discharge 10/01/2020 1018 by Fernanda Drum, RN Outcome: Progressing Goal: Cardiovascular complication will be avoided 10/01/2020 1054 by Fernanda Drum, RN Outcome: Adequate for Discharge 10/01/2020 1018 by Fernanda Drum, RN Outcome: Progressing   Problem: Education: Goal: Knowledge of condition will improve 10/01/2020 1054 by Fernanda Drum, RN Outcome: Adequate for Discharge 10/01/2020 1018 by Fernanda Drum, RN Outcome: Progressing   Problem: Activity: Goal: Will verbalize the importance of balancing activity with adequate rest periods 10/01/2020 1054 by Fernanda Drum, RN Outcome: Adequate for Discharge 10/01/2020 1018 by Fernanda Drum, RN Outcome: Progressing Goal: Ability to tolerate increased activity will improve 10/01/2020 1054 by Fernanda Drum, RN Outcome: Adequate for Discharge 10/01/2020 1018 by Fernanda Drum, RN Outcome: Progressing   Problem: Coping: Goal: Ability to identify and  utilize available resources and services will improve 10/01/2020 1054 by Fernanda Drum, RN Outcome: Adequate for Discharge 10/01/2020 1018 by Fernanda Drum, RN Outcome: Progressing   Problem: Life Cycle: Goal: Chance of risk for complications during the postpartum period will decrease 10/01/2020 1054 by Fernanda Drum, RN Outcome: Adequate for Discharge 10/01/2020 1018 by Fernanda Drum, RN Outcome: Progressing   Problem: Skin Integrity: Goal: Demonstration of wound healing without infection will improve 10/01/2020 1054 by Fernanda Drum, RN Outcome: Adequate for Discharge 10/01/2020 1018 by Fernanda Drum, RN Outcome: Progressing

## 2020-10-01 NOTE — Discharge Instructions (Signed)
Cesarean Delivery, Care After This sheet gives you information about how to care for yourself after your procedure. Your health care provider may also give you more specific instructions. If you have problems or questions, contact your health care provider. What can I expect after the procedure? After the procedure, it is common to have:  A small amount of blood or clear fluid coming from the incision.  Some redness, swelling, and pain in your incision area.  Some abdominal pain and soreness.  Vaginal bleeding (lochia). Even though you did not have a vaginal delivery, you will still have vaginal bleeding and discharge.  Pelvic cramps.  Fatigue. You may have pain, swelling, and discomfort in the tissue between your vagina and your anus (perineum) if:  Your C-section was unplanned, and you were allowed to labor and push.  An incision was made in the area (episiotomy) or the tissue tore during attempted vaginal delivery. Follow these instructions at home: Incision care  Follow instructions from your health care provider about how to take care of your incision. Make sure you: ? Wash your hands with soap and water before you change your bandage (dressing). If soap and water are not available, use hand sanitizer. ? If you have a dressing, change it or remove it as told by your health care provider. ? Leave stitches (sutures), skin staples, skin glue, or adhesive strips in place. These skin closures may need to stay in place for 2 weeks or longer. If adhesive strip edges start to loosen and curl up, you may trim the loose edges. Do not remove adhesive strips completely unless your health care provider tells you to do that.  Check your incision area every day for signs of infection. Check for: ? More redness, swelling, or pain. ? More fluid or blood. ? Warmth. ? Pus or a bad smell.  Do not take baths, swim, or use a hot tub until your health care provider says it's okay. Ask your health  care provider if you can take showers.  When you cough or sneeze, hug a pillow. This helps with pain and decreases the chance of your incision opening up (dehiscing). Do this until your incision heals.   Medicines  Take over-the-counter and prescription medicines only as told by your health care provider.  If you were prescribed an antibiotic medicine, take it as told by your health care provider. Do not stop taking the antibiotic even if you start to feel better.  Do not drive or use heavy machinery while taking prescription pain medicine. Lifestyle  Do not drink alcohol. This is especially important if you are breastfeeding or taking pain medicine.  Do not use any products that contain nicotine or tobacco, such as cigarettes, e-cigarettes, and chewing tobacco. If you need help quitting, ask your health care provider. Eating and drinking  Drink at least 8 eight-ounce glasses of water every day unless told not to by your health care provider. If you breastfeed, you may need to drink even more water.  Eat high-fiber foods every day. These foods may help prevent or relieve constipation. High-fiber foods include: ? Whole grain cereals and breads. ? Brown rice. ? Beans. ? Fresh fruits and vegetables. Activity  If possible, have someone help you care for your baby and help with household activities for at least a few days after you leave the hospital.  Return to your normal activities as told by your health care provider. Ask your health care provider what activities are safe for   you.  Rest as much as possible. Try to rest or take a nap while your baby is sleeping.  Do not lift anything that is heavier than 10 lbs (4.5 kg), or the limit that you were told, until your health care provider says that it is safe.  Talk with your health care provider about when you can engage in sexual activity. This may depend on your: ? Risk of infection. ? How fast you heal. ? Comfort and desire to  engage in sexual activity.   General instructions  Do not use tampons or douches until your health care provider approves.  Wear loose, comfortable clothing and a supportive and well-fitting bra.  Keep your perineum clean and dry. Wipe from front to back when you use the toilet.  If you pass a blood clot, save it and call your health care provider to discuss. Do not flush blood clots down the toilet before you get instructions from your health care provider.  Keep all follow-up visits for you and your baby as told by your health care provider. This is important. Contact a health care provider if:  You have: ? A fever. ? Bad-smelling vaginal discharge. ? Pus or a bad smell coming from your incision. ? Difficulty or pain when urinating. ? A sudden increase or decrease in the frequency of your bowel movements. ? More redness, swelling, or pain around your incision. ? More fluid or blood coming from your incision. ? A rash. ? Nausea. ? Little or no interest in activities you used to enjoy. ? Questions about caring for yourself or your baby.  Your incision feels warm to the touch.  Your breasts turn red or become painful or hard.  You feel unusually sad or worried.  You vomit.  You pass a blood clot from your vagina.  You urinate more than usual.  You are dizzy or light-headed. Get help right away if:  You have: ? Pain that does not go away or get better with medicine. ? Chest pain. ? Difficulty breathing. ? Blurred vision or spots in your vision. ? Thoughts about hurting yourself or your baby. ? New pain in your abdomen or in one of your legs. ? A severe headache.  You faint.  You bleed from your vagina so much that you fill more than one sanitary pad in one hour. Bleeding should not be heavier than your heaviest period. Summary  After the procedure, it is common to have pain at your incision site, abdominal cramping, and slight bleeding from your vagina.  Check  your incision area every day for signs of infection.  Tell your health care provider about any unusual symptoms.  Keep all follow-up visits for you and your baby as told by your health care provider. This information is not intended to replace advice given to you by your health care provider. Make sure you discuss any questions you have with your health care provider. Document Revised: 03/05/2018 Document Reviewed: 03/05/2018 Elsevier Patient Education  2021 [employer] Inc.  

## 2020-10-03 ENCOUNTER — Inpatient Hospital Stay (HOSPITAL_COMMUNITY): Payer: BC Managed Care – PPO

## 2020-10-03 ENCOUNTER — Inpatient Hospital Stay (HOSPITAL_COMMUNITY)
Admission: AD | Admit: 2020-10-03 | Payer: BC Managed Care – PPO | Source: Home / Self Care | Admitting: Obstetrics & Gynecology

## 2020-10-03 ENCOUNTER — Inpatient Hospital Stay (HOSPITAL_COMMUNITY): Admit: 2020-10-03 | Payer: Self-pay

## 2020-10-03 LAB — SURGICAL PATHOLOGY

## 2020-10-07 ENCOUNTER — Other Ambulatory Visit: Payer: Self-pay

## 2020-10-07 ENCOUNTER — Encounter: Payer: Self-pay | Admitting: Family Medicine

## 2020-10-07 ENCOUNTER — Ambulatory Visit (INDEPENDENT_AMBULATORY_CARE_PROVIDER_SITE_OTHER): Payer: BC Managed Care – PPO | Admitting: Family Medicine

## 2020-10-07 VITALS — BP 121/78 | HR 100 | Ht 59.0 in | Wt 129.0 lb

## 2020-10-07 DIAGNOSIS — Z4889 Encounter for other specified surgical aftercare: Secondary | ICD-10-CM

## 2020-10-07 DIAGNOSIS — O139 Gestational [pregnancy-induced] hypertension without significant proteinuria, unspecified trimester: Secondary | ICD-10-CM

## 2020-10-07 NOTE — Progress Notes (Signed)
   Subjective:    Patient ID: Patricia Delgado, female    DOB: Jan 16, 1991, 30 y.o.   MRN: [id number]  HPI  Approximately 2 weeks postop from cesarean delivery. No concerns. Pain controlled. Tolerating BP well.   Review of Systems    BP 121/78   Pulse 100   Ht 4\' 11"  (1.499 m)   Wt 129 lb (58.5 kg)   LMP 12/28/2019   BMI 26.05 kg/m   Objective:   Physical Exam Vitals reviewed.  Constitutional:      Appearance: Normal appearance.  Cardiovascular:     Rate and Rhythm: Normal rate.  Pulmonary:     Effort: Pulmonary effort is normal.  Abdominal:     Comments: Incision clean, dry, intact. Well healing. Some small (1cm) exposure of subdermal tissue on left corner.   Skin:    Capillary Refill: Capillary refill takes less than 2 seconds.  Neurological:     General: No focal deficit present.     Mental Status: She is alert.       Assessment & Plan:  1. Encounter for post surgical wound check Healing well. Return precautions discussed.  2. Gestational hypertension, antepartum Continue nifedipine

## 2020-11-01 ENCOUNTER — Ambulatory Visit (INDEPENDENT_AMBULATORY_CARE_PROVIDER_SITE_OTHER): Payer: BC Managed Care – PPO | Admitting: Advanced Practice Midwife

## 2020-11-01 ENCOUNTER — Other Ambulatory Visit (HOSPITAL_COMMUNITY)
Admission: RE | Admit: 2020-11-01 | Discharge: 2020-11-01 | Disposition: A | Discharge status: Home / Self Care | Payer: BC Managed Care – PPO | Source: Ambulatory Visit | Attending: Family Medicine | Admitting: Family Medicine

## 2020-11-01 ENCOUNTER — Encounter: Payer: Self-pay | Admitting: Advanced Practice Midwife

## 2020-11-01 ENCOUNTER — Other Ambulatory Visit: Payer: Self-pay

## 2020-11-01 VITALS — BP 103/62 | HR 86 | Wt 125.0 lb

## 2020-11-01 DIAGNOSIS — Z8632 Personal history of gestational diabetes: Secondary | ICD-10-CM

## 2020-11-01 DIAGNOSIS — O139 Gestational [pregnancy-induced] hypertension without significant proteinuria, unspecified trimester: Secondary | ICD-10-CM

## 2020-11-01 DIAGNOSIS — O24410 Gestational diabetes mellitus in pregnancy, diet controlled: Secondary | ICD-10-CM

## 2020-11-01 MED ORDER — NORETHINDRONE 0.35 MG PO TABS: 1.0000 | ORAL_TABLET | Freq: Every day | ORAL | 11 refills | Status: AC

## 2020-11-01 NOTE — Patient Instructions (Signed)
Cesarean Delivery, Care After This sheet gives you information about how to care for yourself after your procedure. Your health care provider may also give you more specific instructions. If you have problems or questions, contact your health care provider. What can I expect after the procedure? After the procedure, it is common to have:  A small amount of blood or clear fluid coming from the incision.  Some redness, swelling, and pain in your incision area.  Some abdominal pain and soreness.  Vaginal bleeding (lochia). Even though you did not have a vaginal delivery, you will still have vaginal bleeding and discharge.  Pelvic cramps.  Fatigue. You may have pain, swelling, and discomfort in the tissue between your vagina and your anus (perineum) if:  Your C-section was unplanned, and you were allowed to labor and push.  An incision was made in the area (episiotomy) or the tissue tore during attempted vaginal delivery. Follow these instructions at home: Incision care  Follow instructions from your health care provider about how to take care of your incision. Make sure you: ? Wash your hands with soap and water before you change your bandage (dressing). If soap and water are not available, use hand sanitizer. ? If you have a dressing, change it or remove it as told by your health care provider. ? Leave stitches (sutures), skin staples, skin glue, or adhesive strips in place. These skin closures may need to stay in place for 2 weeks or longer. If adhesive strip edges start to loosen and curl up, you may trim the loose edges. Do not remove adhesive strips completely unless your health care provider tells you to do that.  Check your incision area every day for signs of infection. Check for: ? More redness, swelling, or pain. ? More fluid or blood. ? Warmth. ? Pus or a bad smell.  Do not take baths, swim, or use a hot tub until your health care provider says it's okay. Ask your health  care provider if you can take showers.  When you cough or sneeze, hug a pillow. This helps with pain and decreases the chance of your incision opening up (dehiscing). Do this until your incision heals.   Medicines  Take over-the-counter and prescription medicines only as told by your health care provider.  If you were prescribed an antibiotic medicine, take it as told by your health care provider. Do not stop taking the antibiotic even if you start to feel better.  Do not drive or use heavy machinery while taking prescription pain medicine. Lifestyle  Do not drink alcohol. This is especially important if you are breastfeeding or taking pain medicine.  Do not use any products that contain nicotine or tobacco, such as cigarettes, e-cigarettes, and chewing tobacco. If you need help quitting, ask your health care provider. Eating and drinking  Drink at least 8 eight-ounce glasses of water every day unless told not to by your health care provider. If you breastfeed, you may need to drink even more water.  Eat high-fiber foods every day. These foods may help prevent or relieve constipation. High-fiber foods include: ? Whole grain cereals and breads. ? Brown rice. ? Beans. ? Fresh fruits and vegetables. Activity  If possible, have someone help you care for your baby and help with household activities for at least a few days after you leave the hospital.  Return to your normal activities as told by your health care provider. Ask your health care provider what activities are safe for   you.  Rest as much as possible. Try to rest or take a nap while your baby is sleeping.  Do not lift anything that is heavier than 10 lbs (4.5 kg), or the limit that you were told, until your health care provider says that it is safe.  Talk with your health care provider about when you can engage in sexual activity. This may depend on your: ? Risk of infection. ? How fast you heal. ? Comfort and desire to  engage in sexual activity.   General instructions  Do not use tampons or douches until your health care provider approves.  Wear loose, comfortable clothing and a supportive and well-fitting bra.  Keep your perineum clean and dry. Wipe from front to back when you use the toilet.  If you pass a blood clot, save it and call your health care provider to discuss. Do not flush blood clots down the toilet before you get instructions from your health care provider.  Keep all follow-up visits for you and your baby as told by your health care provider. This is important. Contact a health care provider if:  You have: ? A fever. ? Bad-smelling vaginal discharge. ? Pus or a bad smell coming from your incision. ? Difficulty or pain when urinating. ? A sudden increase or decrease in the frequency of your bowel movements. ? More redness, swelling, or pain around your incision. ? More fluid or blood coming from your incision. ? A rash. ? Nausea. ? Little or no interest in activities you used to enjoy. ? Questions about caring for yourself or your baby.  Your incision feels warm to the touch.  Your breasts turn red or become painful or hard.  You feel unusually sad or worried.  You vomit.  You pass a blood clot from your vagina.  You urinate more than usual.  You are dizzy or light-headed. Get help right away if:  You have: ? Pain that does not go away or get better with medicine. ? Chest pain. ? Difficulty breathing. ? Blurred vision or spots in your vision. ? Thoughts about hurting yourself or your baby. ? New pain in your abdomen or in one of your legs. ? A severe headache.  You faint.  You bleed from your vagina so much that you fill more than one sanitary pad in one hour. Bleeding should not be heavier than your heaviest period. Summary  After the procedure, it is common to have pain at your incision site, abdominal cramping, and slight bleeding from your vagina.  Check  your incision area every day for signs of infection.  Tell your health care provider about any unusual symptoms.  Keep all follow-up visits for you and your baby as told by your health care provider. This information is not intended to replace advice given to you by your health care provider. Make sure you discuss any questions you have with your health care provider. Document Revised: 03/05/2018 Document Reviewed: 03/05/2018 Elsevier Patient Education  2021 Elsevier Inc.  

## 2020-11-01 NOTE — Progress Notes (Signed)
Post Partum Visit Note  Patricia Delgado is a 30 y.o. G1P0 female who presents for a postpartum visit. She is 5 weeks postpartum following a primary cesarean section.  I have fully reviewed the prenatal and intrapartum course. The delivery was at 39 gestational weeks.  Anesthesia: epidural. Postpartum course has been uneventful. Baby is doing well breastfeeding. Baby is feeding by breast. Bleeding staining only. Bowel function is normal. Bladder function is normal. Patient is not sexually active. Contraception method is none. Postpartum depression screening: negative.   The pregnancy intention screening data noted above was reviewed. Potential methods of contraception were discussed. The patient elected to proceed with Abstinence and Oral Contraceptive. Is abstinent for now but will start [place]  And transition after weaning to COCs.     The following portions of the patient's history were reviewed and updated as appropriate: allergies, current medications, past family history, past medical history, past social history, past surgical history and problem list.  Review of Systems Pertinent items are noted in HPI.    Objective:  LMP 12/28/2019   Vitals:   11/01/20 1049  BP: 103/62  Pulse: 86    General:  alert, cooperative and no distress   Breasts:  inspection negative, no nipple discharge or bleeding, no masses or nodularity palpable  Lungs: clear to auscultation bilaterally  Heart:  regular rate and rhythm, S1, S2 normal, no murmur, click, rub or gallop  Abdomen: soft, non-tender; bowel sounds normal; no masses,  no organomegaly   Incision well healed    Vulva:  normal  Vagina: normal vagina  Cervix:  no bleeding following Pap  Corpus: normal  Adnexa:  normal adnexa  Rectal Exam: Not performed.        Assessment:    Normal  postpartum exam. Pap smear done at today's visit.   Plan:   Essential components of care per ACOG recommendations:  1.  Mood and well being: Patient  with negative depression screening today. Reviewed local resources for support.  - Patient does not use tobacco. If using tobacco we discussed reduction and for recently cessation risk of relapse - hx of drug use? No   If yes, discussed support systems  2. Infant care and feeding:  -Patient currently breastmilk feeding? Yes If breastmilk feeding discussed return to work and pumping. If needed, patient was provided letter for work to allow for every 2-3 hr pumping breaks, and to be granted a private location to express breastmilk and refrigerated area to store breastmilk. Reviewed importance of draining breast regularly to support lactation. -Social determinants of health (SDOH) reviewed in EPIC. No concernsThe following needs were identified  3. Sexuality, contraception and birth spacing - Patient does not want a pregnancy in the next year.  Desired family size is unknown  children.  - Reviewed forms of contraception in tiered fashion. Patient desired oral progesterone-only contraceptive today.   - Discussed birth spacing of 18 months  4. Sleep and fatigue -Encouraged family/partner/community support of 4 hrs of uninterrupted sleep to help with mood and fatigue  5. Physical Recovery  - Discussed patients delivery and complications -  - Patient has urinary incontinence? No Patient was referred to pelvic floor PT  - Patient is safe to resume physical and sexual activity  6.  Health Maintenance -    Pap done today      Will have her stop Antihypertensive  7. NoChronic Disease - PCP follow up  chiquita l wilson, CMA [facility] [employer]  Aviva Signs, CNM

## 2020-11-02 ENCOUNTER — Ambulatory Visit: Payer: BC Managed Care – PPO | Admitting: Family Medicine

## 2020-11-02 LAB — CYTOLOGY - PAP: Diagnosis: NEGATIVE

## 2020-11-02 LAB — GLUCOSE TOLERANCE, 2 HOURS
Glucose, 2 hour: 115 mg/dL (ref 65–139)
Glucose, GTT - Fasting: 77 mg/dL (ref 65–99)

## 2020-11-03 DIAGNOSIS — Z713 Dietary counseling and surveillance: Secondary | ICD-10-CM | POA: Diagnosis not present

## 2020-11-03 DIAGNOSIS — Z6825 Body mass index (BMI) 25.0-25.9, adult: Secondary | ICD-10-CM | POA: Diagnosis not present

## 2020-11-03 DIAGNOSIS — Z13220 Encounter for screening for lipoid disorders: Secondary | ICD-10-CM | POA: Diagnosis not present

## 2020-11-03 DIAGNOSIS — Z136 Encounter for screening for cardiovascular disorders: Secondary | ICD-10-CM | POA: Diagnosis not present

## 2020-11-10 ENCOUNTER — Ambulatory Visit: Payer: BC Managed Care – PPO | Admitting: Family Medicine

## 2021-08-21 ENCOUNTER — Encounter: Payer: Self-pay | Admitting: General Practice

## 2021-11-26 IMAGING — US US MFM OB DETAIL+14 WK
1 series · 13 of 28 positions shown · non-contrast
Comparison: none

[Series 1: us mfm ob detail+14 wk · 13 of 103 slices shown]
[im 4/103]
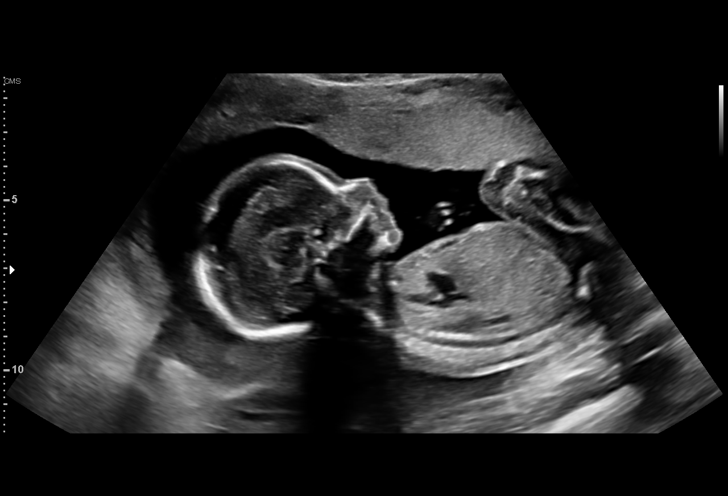
[im 12/103]
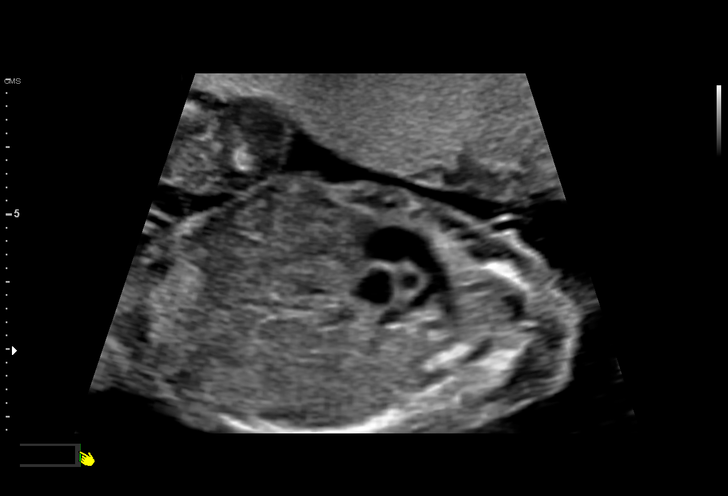
[im 19/103]
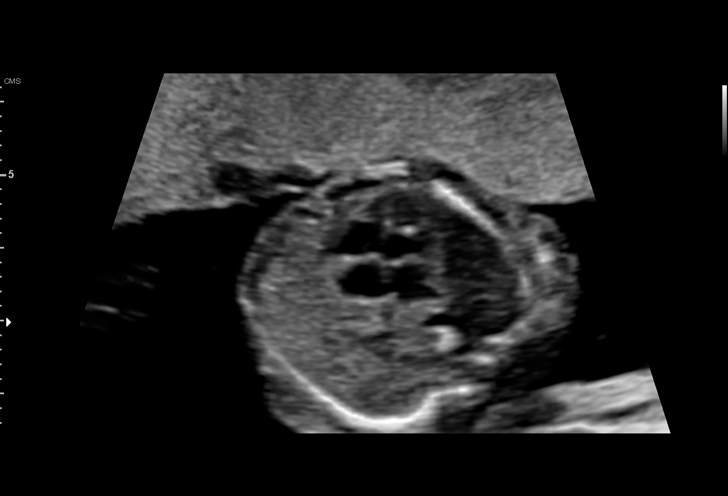
[im 27/103]
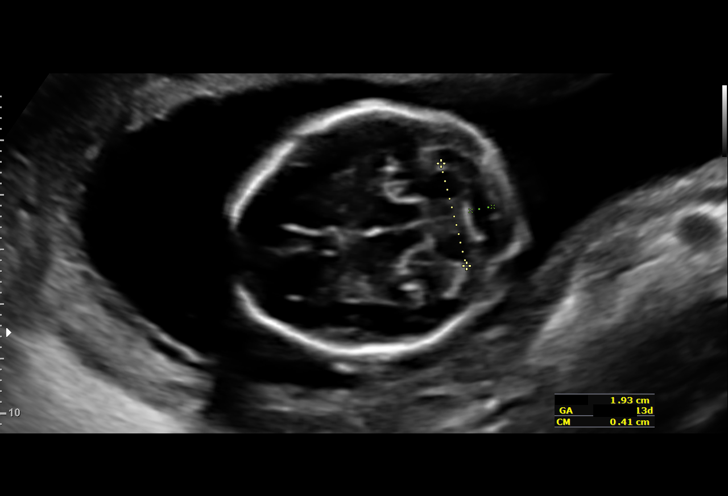
[im 35/103]
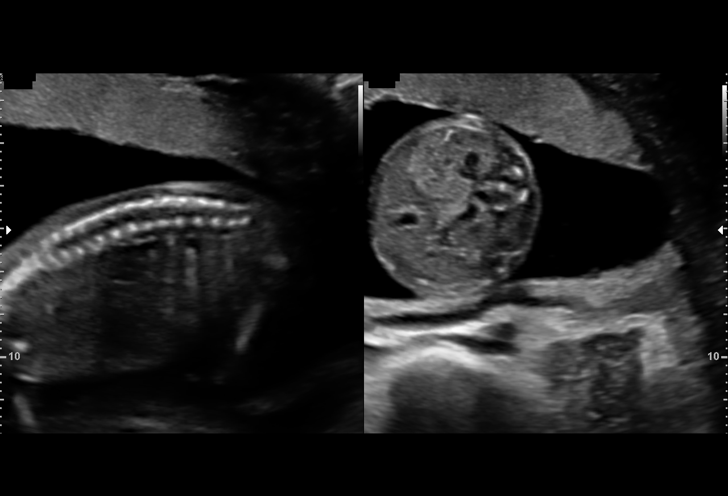
[im 42/103]
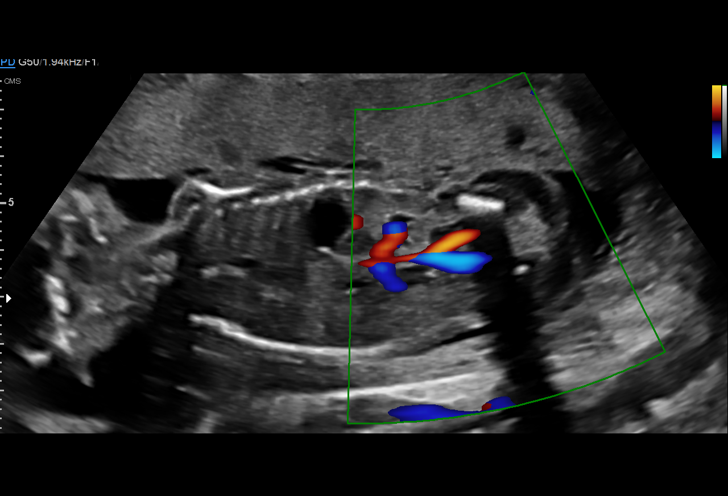
[im 53/103]
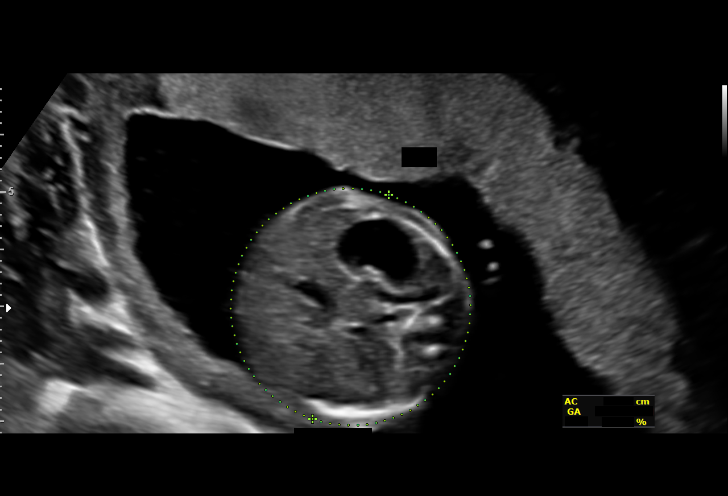
[im 61/103]
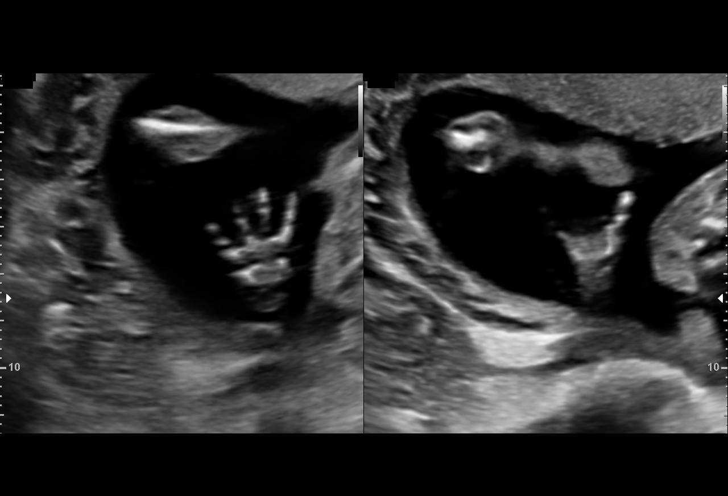
[im 69/103]
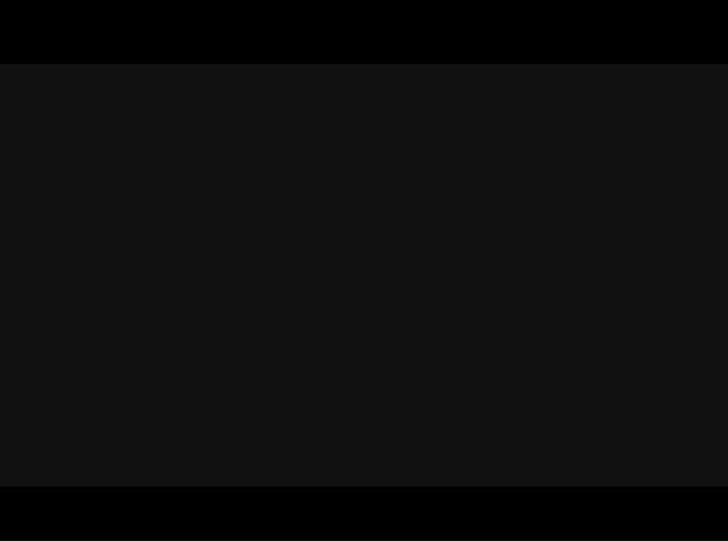
[im 76/103]
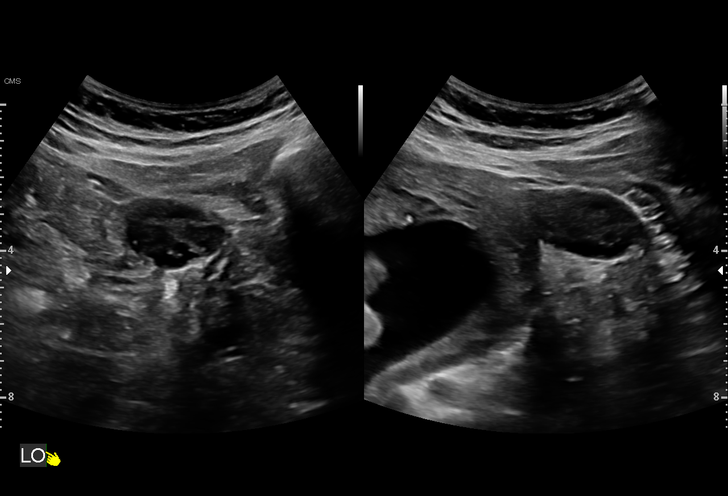
[im 84/103]
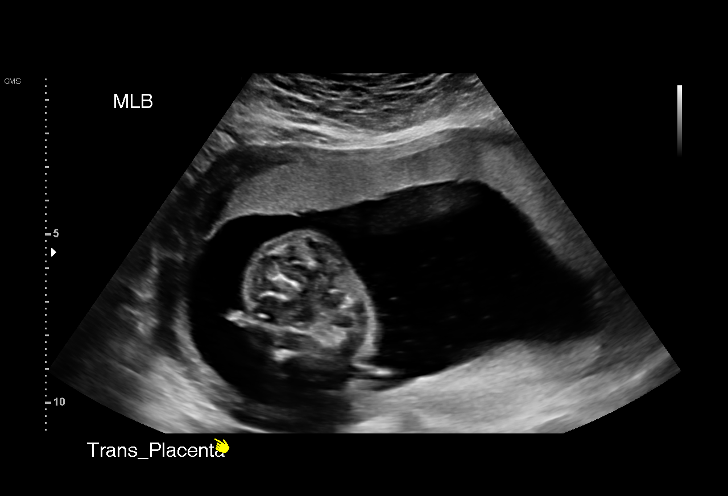
[im 91/103]
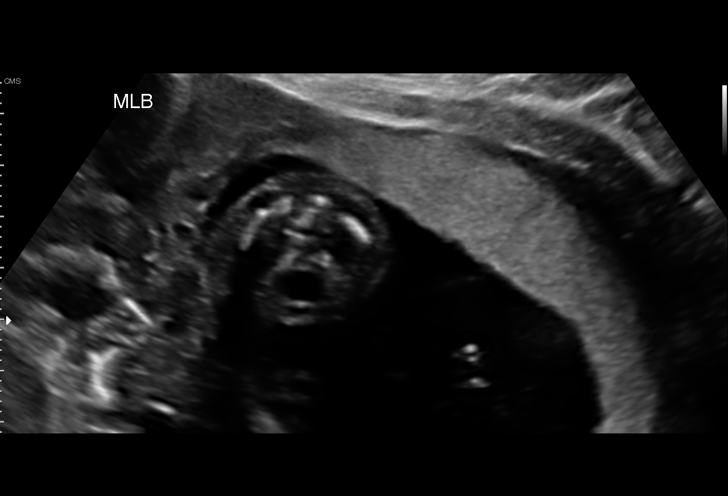
[im 99/103]
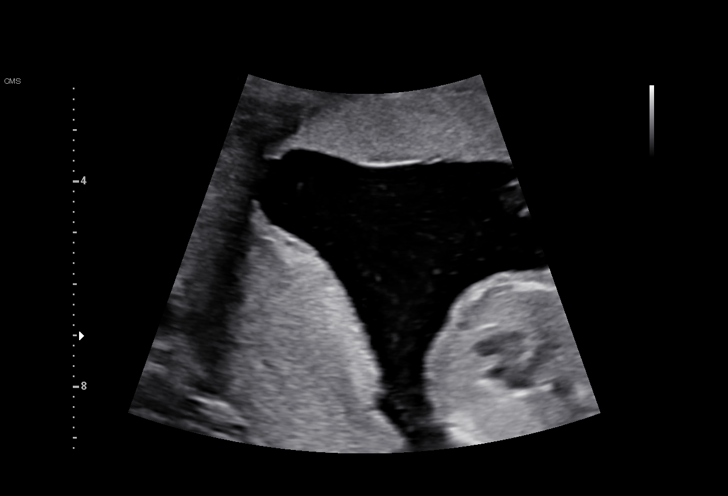

[13 of 28 positions shown; findings below may reference images not displayed]

Indications

 Echogenic intracardiac focus of the heart
 (EIF)
 Encounter for antenatal screening for
 malformations
 19 weeks gestation of pregnancy
Fetal Evaluation

 Num Of Fetuses:         1
 Fetal Heart Rate(bpm):  148
 Cardiac Activity:       Observed
 Presentation:           Breech
 Placenta:               Anterior
 P. Cord Insertion:      Visualized

 Amniotic Fluid
 AFI FV:      Within normal limits

                             Largest Pocket(cm)

Biometry

 BPD:      43.2  mm     G. Age:  19w 0d         54  %    CI:        75.38   %    70 - 86
                                                         FL/HC:      17.8   %    16.1 -
 HC:      157.8  mm     G. Age:  18w 5d         26  %    HC/AC:      1.21        1.09 -
 AC:      130.5  mm     G. Age:  18w 4d         32  %    FL/BPD:     65.0   %
 FL:       28.1  mm     G. Age:  18w 4d         29  %    FL/AC:      21.5   %    20 - 24
 HUM:      27.1  mm     G. Age:  18w 4d         40  %
 CER:      19.3  mm     G. Age:  18w 6d         26  %
 NFT:       3.9  mm
 CM:        4.1  mm
 Est. FW:     249  gm      0 lb 9 oz     25  %
OB History

 Gravidity:    1         Term:   0        Prem:   0        SAB:   0
 TOP:          0       Ectopic:  0        Living: 0
Gestational Age

 LMP:           19w 0d        Date:  12/28/19                 EDD:   10/03/20
 U/S Today:     18w 5d                                        EDD:   10/05/20
 Best:          19w 0d     Det. By:  LMP  (12/28/19)          EDD:   10/03/20
Anatomy

 Cranium:               Appears normal         Aortic Arch:            Appears normal
 Cavum:                 Appears normal         Ductal Arch:            Appears normal
 Ventricles:            Appears normal         Diaphragm:              Appears normal
 Choroid Plexus:        Appears normal         Stomach:                Appears normal, left
                                                                       sided
 Cerebellum:            Appears normal         Abdomen:                Appears normal
 Posterior Fossa:       Appears normal         Abdominal Wall:         Appears nml (cord
                                                                       insert, abd wall)
 Nuchal Fold:           Appears normal         Cord Vessels:           Appears normal (3
                                                                       vessel cord)
 Face:                  Appears normal         Kidneys:                Appear normal
                        (orbits and profile)
 Lips:                  Appears normal         Bladder:                Appears normal
 Thoracic:              Appears normal         Spine:                  Appears normal
 Heart:                 Appears normal; EIF    Upper Extremities:      Appears normal
 RVOT:                  Appears normal         Lower Extremities:      Appears normal
 LVOT:                  Appears normal

 Other:  Heels/feet and open hands/5th digits visualized. Nasal bone
         visualized. 3VV and 3VTV visualized.
Cervix Uterus Adnexa

 Cervix
 Length:           3.86  cm.
 Normal appearance by transabdominal scan.

 Uterus
 No abnormality visualized.

 Right Ovary
 Within normal limits.

 Left Ovary
 Within normal limits.

 Cul De Sac
 No free fluid seen.

 Adnexa
 No abnormality visualized.
Comments

 This patient was seen for a detailed fetal anatomy scan.
 She denies any significant past medical history and denies
 any problems in her current pregnancy.
 She had a cell free DNA test earlier in her pregnancy which
 indicated a low risk for trisomy 21, 18, and 13.  The patient
 did not want the fetal gender revealed.
 She was informed that the fetal growth and amniotic fluid
 level were appropriate for her gestational age.
 On today's exam, an intracardiac echogenic focus was noted
 in the left ventricle of the fetal heart.  The small association
 between an echogenic focus and Down syndrome was
 discussed. Due to the echogenic focus noted today, the
 patient was offered and declined an amniocentesis today for
 definitive diagnosis of fetal aneuploidy.  She reports that she
 is comfortable with her negative cell free DNA test.
 The patient was informed that anomalies may be missed due
 to technical limitations. If the fetus is in a suboptimal position
 or maternal habitus is increased, visualization of the fetus in
 the maternal uterus may be impaired.
 Follow up as indicated.

## 2022-02-25 IMAGING — US US MFM OB FOLLOW-UP
1 series · 14 of 28 positions shown · non-contrast
Comparison: none

[Series 1: us mfm ob follow-up · 36 acquisitions, 14 frames shown]
[im 2/36]
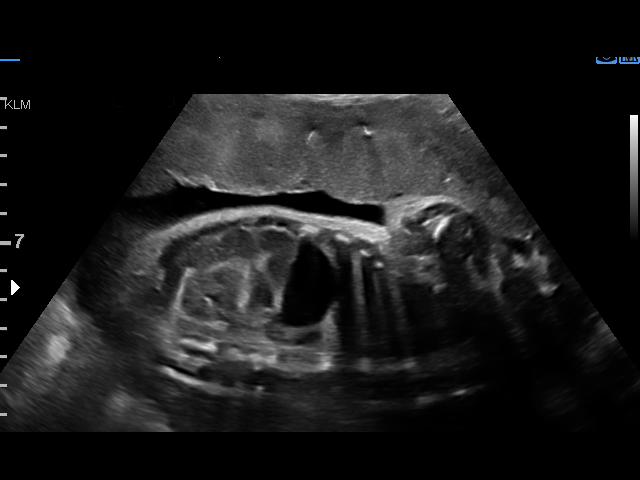
[im 4/36]
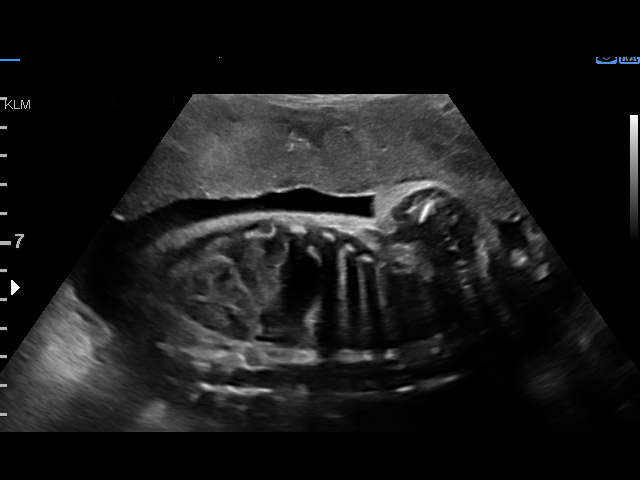
[im 7/36]
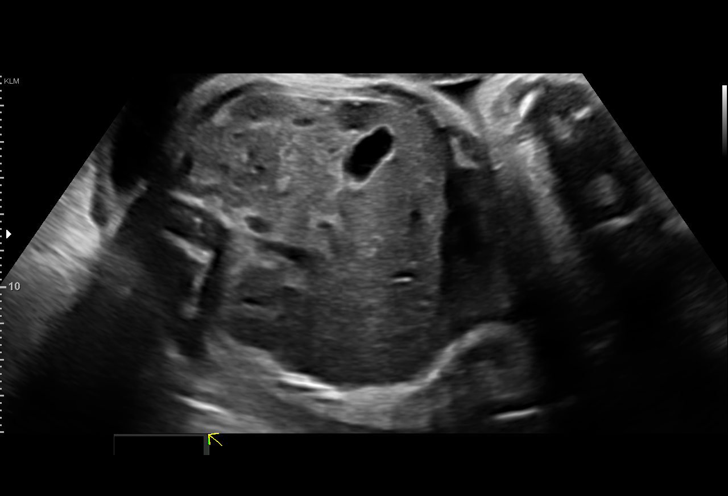
[im 10/36]
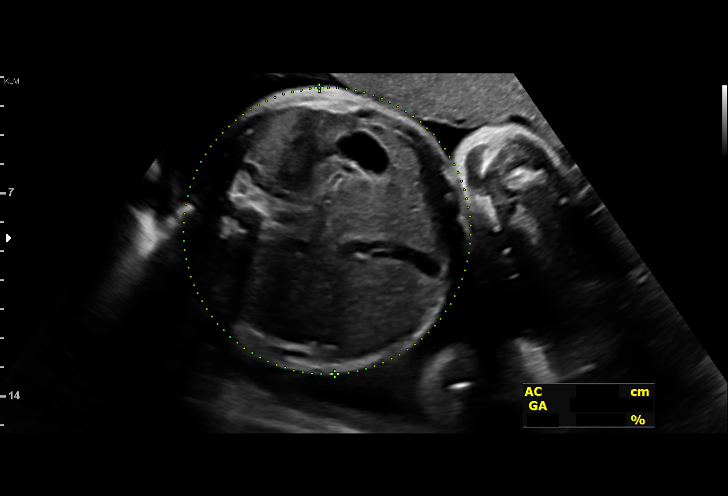
[im 12/36]
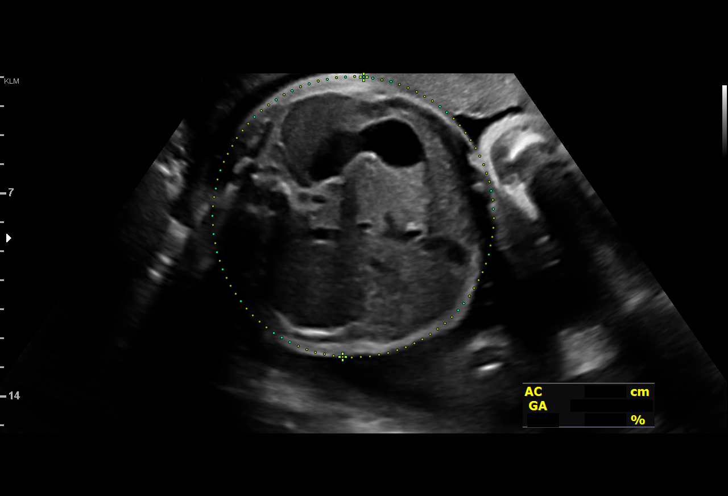
[im 15/36]
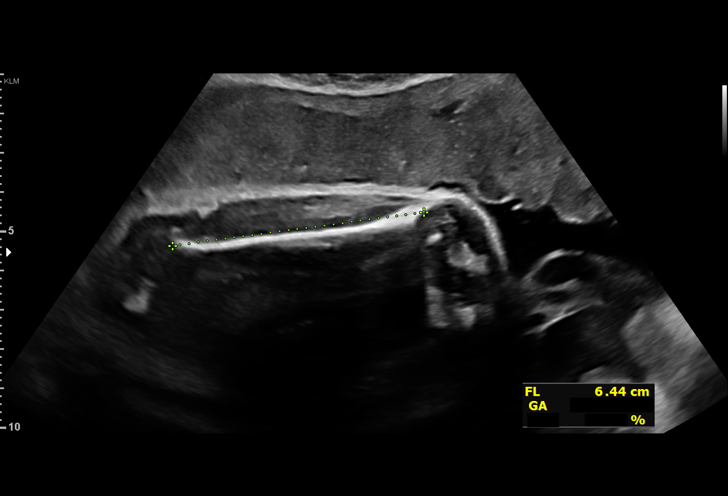
[im 17/36]
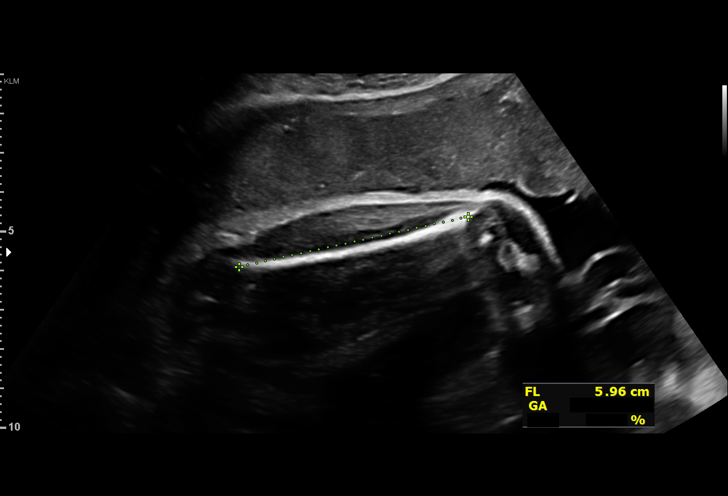
[im 20/36]
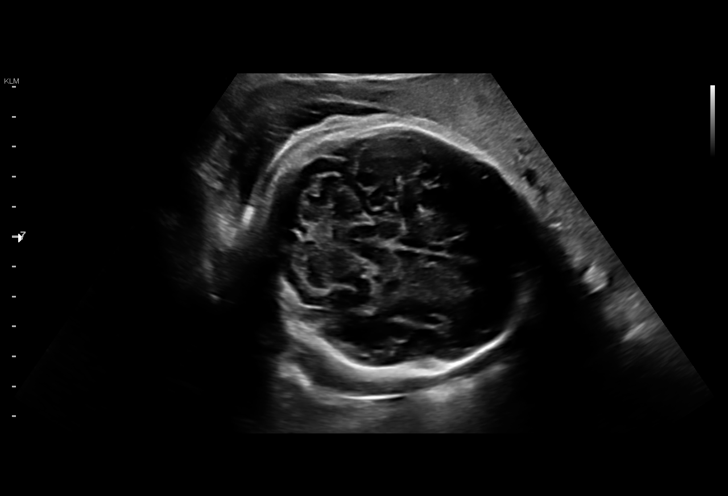
[im 23/36]
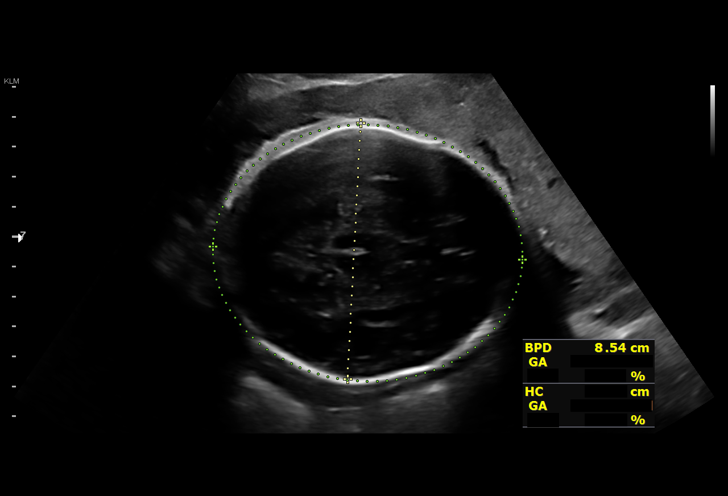
[im 25/36]
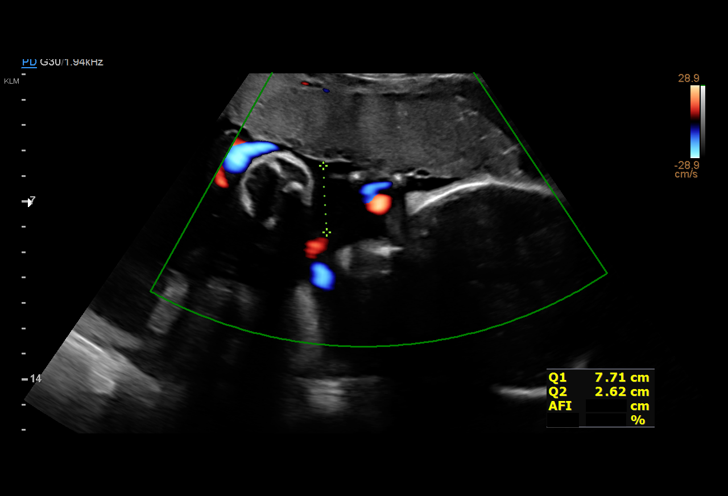
[im 28/36]
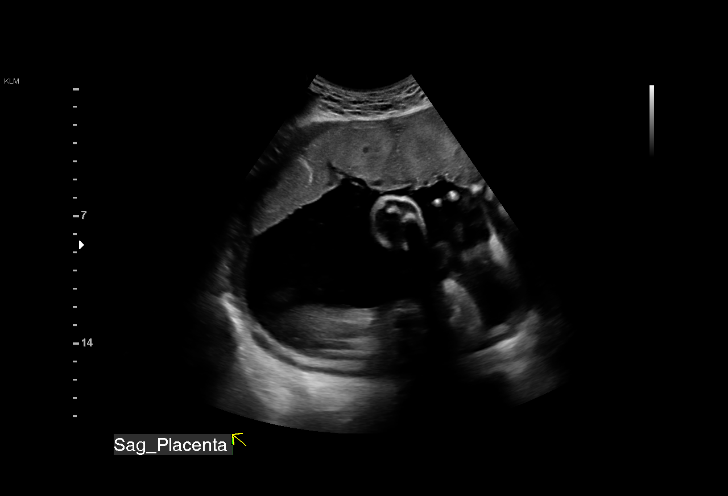
[im 30/36]
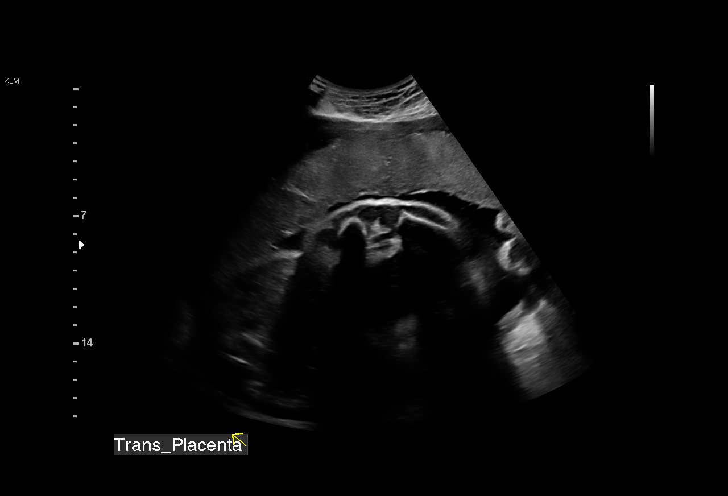
[im 33/36]
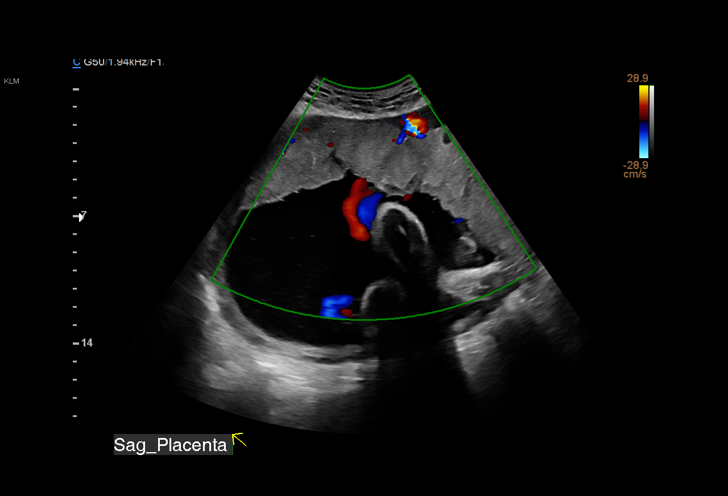
[im 36/36]
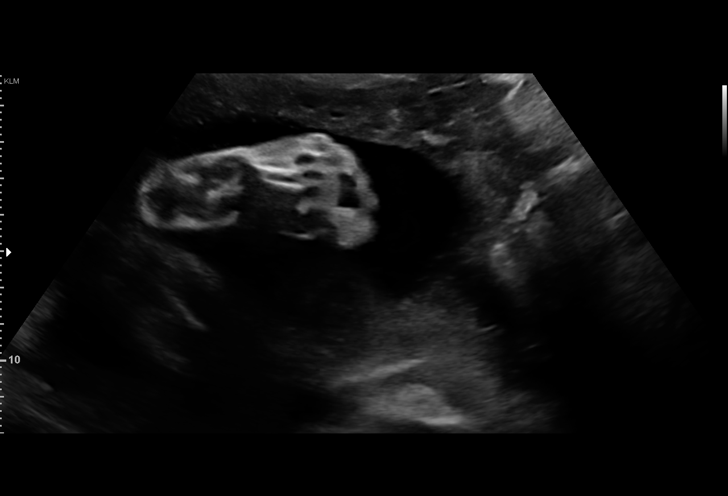

[14 of 28 positions shown; findings below may reference images not displayed]

Indications

 Gestational diabetes in pregnancy, diet
 controlled
 32 weeks gestation of pregnancy
 Echogenic intracardiac focus of the heart
 (EIF)
 Encounter for other antenatal screening
 follow-up
Fetal Evaluation

 Num Of Fetuses:          1
 Fetal Heart Rate(bpm):   127
 Cardiac Activity:        Observed
 Presentation:            Cephalic
 Placenta:                Anterior
 P. Cord Insertion:       Visualized

 Amniotic Fluid
 AFI FV:      Within normal limits

 AFI Sum(cm)     %Tile       Largest Pocket(cm)
 17.46           64

 RUQ(cm)       RLQ(cm)       LUQ(cm)        LLQ(cm)

Biometry

 BPD:        85  mm     G. Age:  34w 2d         94  %    CI:        77.93   %    70 - 86
                                                         FL/HC:       20.6  %    19.1 -
 HC:      304.7  mm     G. Age:  33w 6d         65  %    HC/AC:       0.99       0.96 -
 AC:        308  mm     G. Age:  34w 5d         98  %    FL/BPD:      73.8  %    71 - 87
 FL:       62.7  mm     G. Age:  32w 3d         50  %    FL/AC:       20.4  %    20 - 24

 Est. FW:    0400   gm     5 lb 2 oz     93  %
OB History

 Gravidity:    1         Term:   0        Prem:   0        SAB:   0
 TOP:          0       Ectopic:  0        Living: 0
Gestational Age

 LMP:           32w 0d        Date:  12/28/19                 EDD:   10/03/20
 U/S Today:     33w 6d                                        EDD:   09/20/20
 Best:          32w 0d     Det. By:  LMP  (12/28/19)          EDD:   10/03/20
Anatomy

 Cranium:               Appears normal         Aortic Arch:            Previously seen
 Cavum:                 Appears normal         Ductal Arch:            Previously seen
 Ventricles:            Appears normal         Diaphragm:              Appears normal
 Choroid Plexus:        Appears normal         Stomach:                Appears normal, left
                                                                       sided
 Cerebellum:            Appears normal         Abdomen:                Appears normal
 Posterior Fossa:       Previously seen        Abdominal Wall:         Previously seen
 Nuchal Fold:           Previously seen        Cord Vessels:           Previously seen
 Face:                  Orbits and profile     Kidneys:                Appear normal
                        previously seen
 Lips:                  Previously seen        Bladder:                Appears normal
 Thoracic:              Appears normal         Spine:                  Previously seen
 Heart:                 Appears normal;        Upper Extremities:      Previously seen
                        EIF prev. seen
 RVOT:                  Previously seen        Lower Extremities:      Previously seen
 LVOT:                  Previously seen

 Other:  Heels/feet and open hands/5th digits previously  visualized. Nasal
         bone previously visualized. 3VV and 3VTV previously visualized.
Cervix Uterus Adnexa

 Cervix
 Not visualized (advanced GA >72wks)
Impression

 Follow up growth due to SDOBW with known EIF
 Normal interval growth with measurements consistent with
 dates
 Good fetal movement and amniotic fluid volume
Recommendations

 Follow up growth in 4 weeks given GDM, if therapy is
 required consider weekly testing.

## 2022-03-27 IMAGING — US US MFM OB FOLLOW-UP
1 series · 14 of 28 positions shown · non-contrast
Comparison: none

[Series 1: us mfm ob follow-up · 14 of 42 slices shown]
[im 2/42]
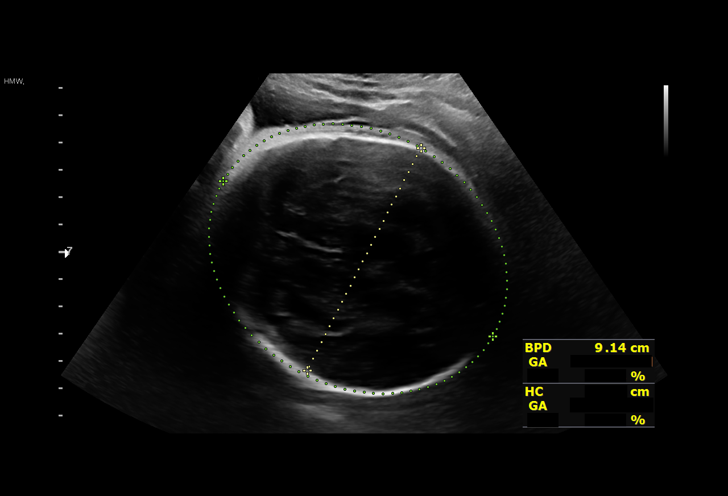
[im 5/42]
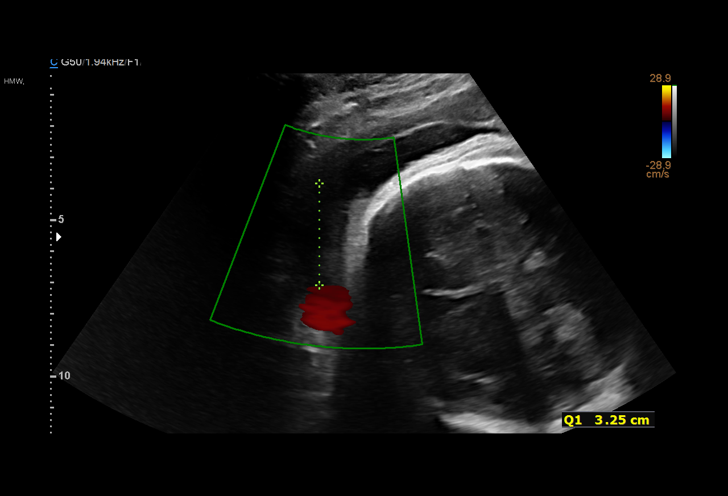
[im 8/42]
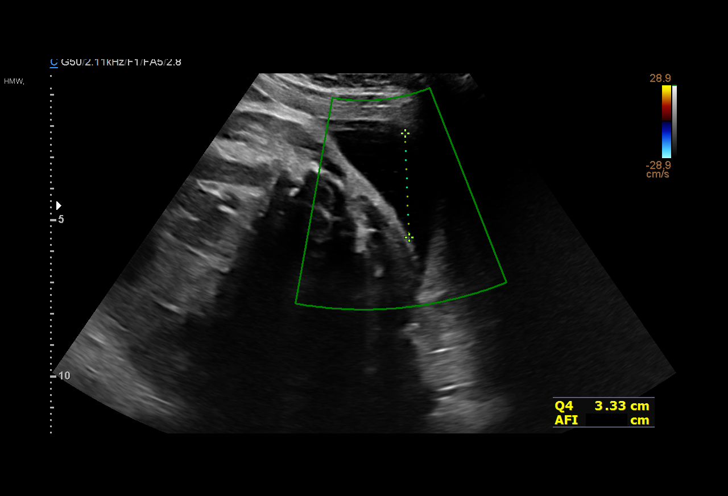
[im 11/42]
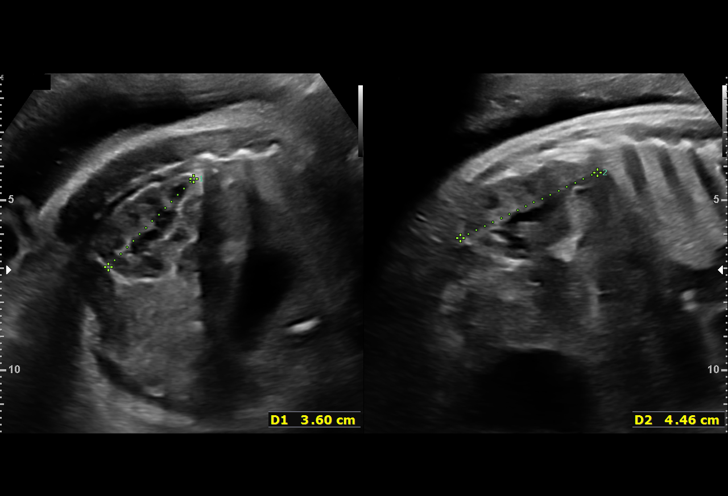
[im 14/42]
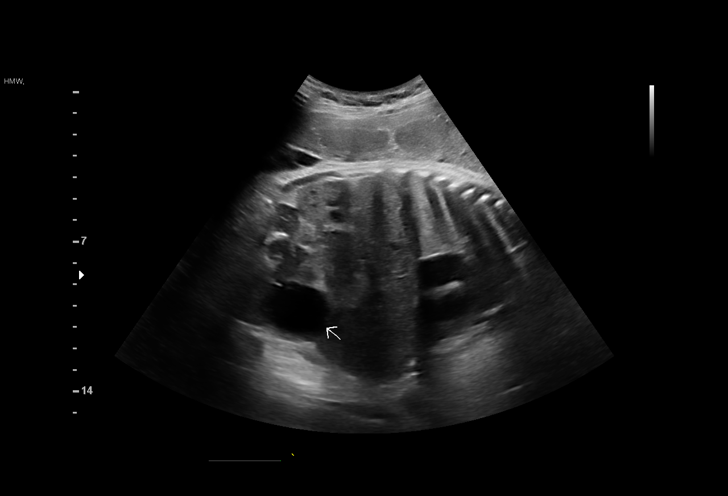
[im 17/42]
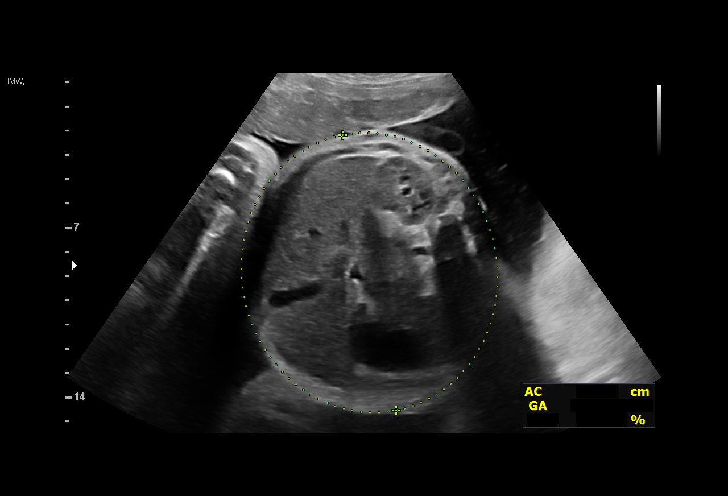
[im 20/42]
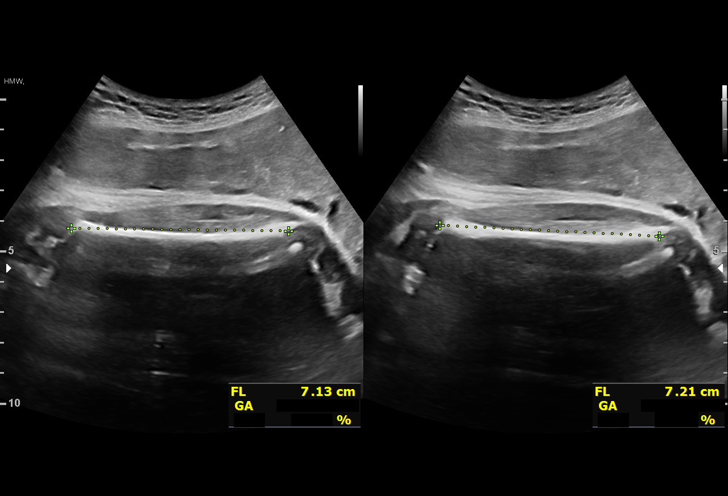
[im 23/42]
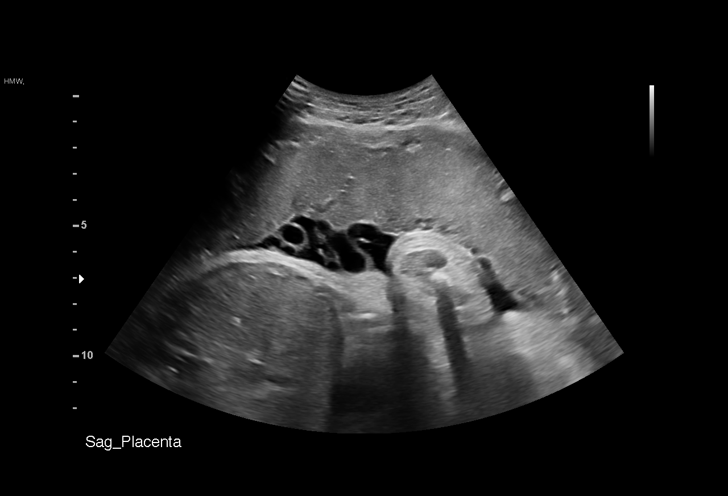
[im 26/42]
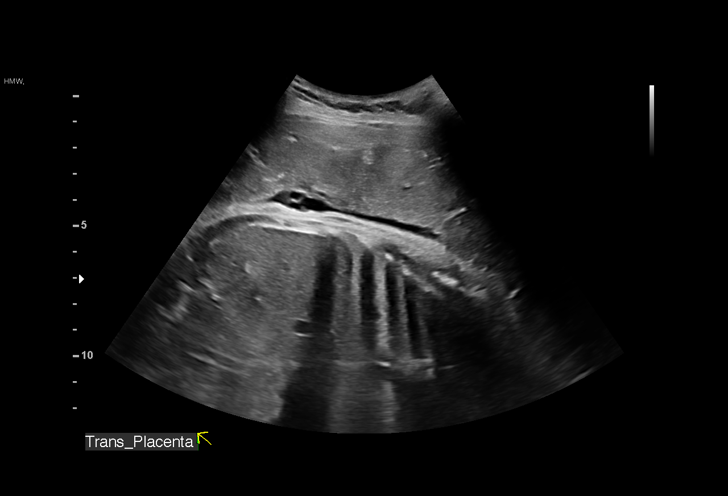
[im 29/42]
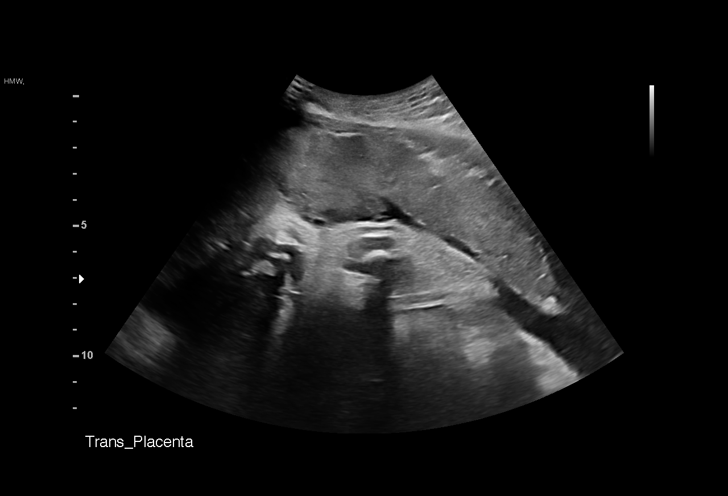
[im 32/42]
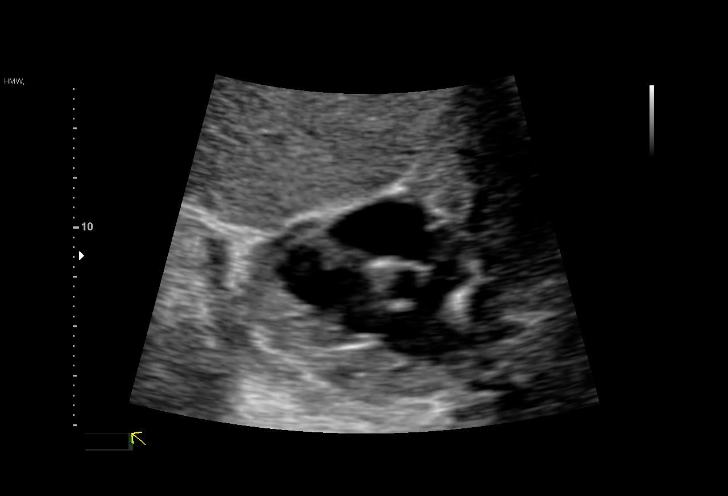
[im 35/42]
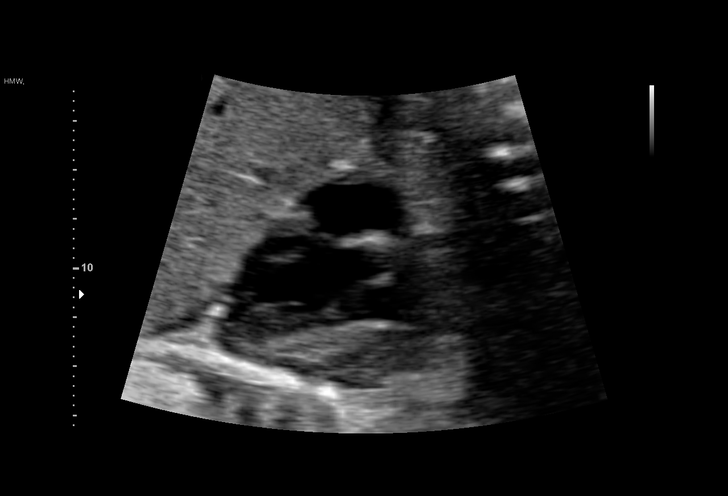
[im 38/42]
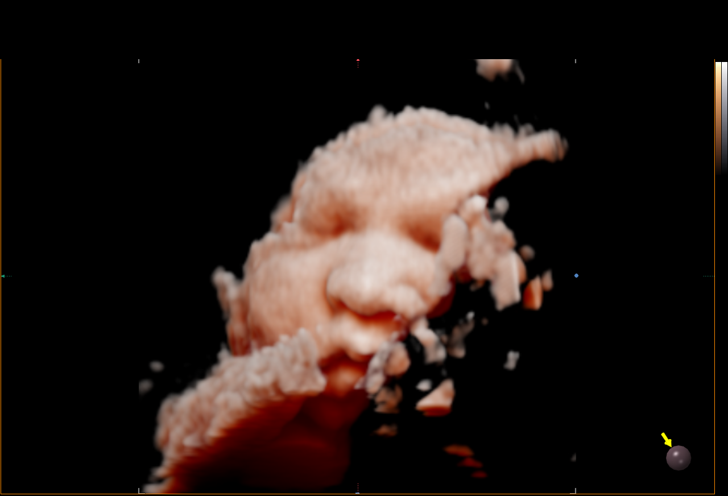
[im 42/42]
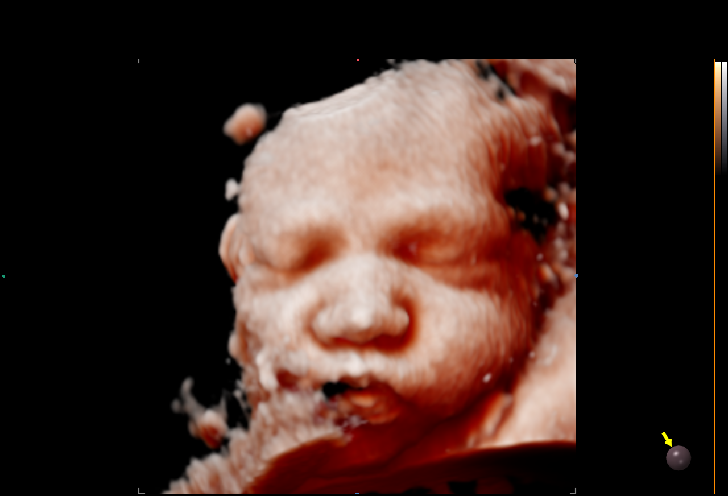

[14 of 28 positions shown; findings below may reference images not displayed]

Indications

 Gestational diabetes in pregnancy, diet
 controlled
 36 weeks gestation of pregnancy
 Echogenic intracardiac focus of the heart
 (EIF)
 Encounter for other antenatal screening
 follow-up
Fetal Evaluation

 Num Of Fetuses:          1
 Fetal Heart Rate(bpm):   123
 Cardiac Activity:        Observed
 Presentation:            Cephalic
 Placenta:                Anterior
 P. Cord Insertion:       Previously Visualized

 Amniotic Fluid
 AFI FV:      Within normal limits

 AFI Sum(cm)     %Tile       Largest Pocket(cm)
 14.47           53

 RUQ(cm)       RLQ(cm)       LUQ(cm)        LLQ(cm)

Biometry

 BPD:      90.7  mm     G. Age:  36w 5d         73  %    CI:        78.46   %    70 - 86
                                                         FL/HC:       22.0  %    20.1 -
 HC:      323.9  mm     G. Age:  36w 5d         29  %    HC/AC:       0.95       0.93 -
 AC:      342.3  mm     G. Age:  38w 1d         95  %    FL/BPD:      78.7  %    71 - 87
 FL:       71.4  mm     G. Age:  36w 4d         54  %    FL/AC:       20.9  %    20 - 24

 Est. FW:    8399   gm     7 lb 1 oz     82  %
OB History

 Gravidity:    1         Term:   0        Prem:   0        SAB:   0
 TOP:          0       Ectopic:  0        Living: 0
Gestational Age

 LMP:           36w 2d        Date:  12/28/19                 EDD:   10/03/20
 U/S Today:     37w 0d                                        EDD:   09/28/20
 Best:          36w 2d     Det. By:  LMP  (12/28/19)          EDD:   10/03/20
Anatomy

 Cranium:               Appears normal         Aortic Arch:            Previously seen
 Cavum:                 Previously seen        Ductal Arch:            Previously seen
 Ventricles:            Appears normal         Diaphragm:              Previously seen
 Choroid Plexus:        Previously seen        Stomach:                Appears normal, left
                                                                       sided
 Cerebellum:            Previously seen        Abdomen:                Previously seen
 Posterior Fossa:       Previously seen        Abdominal Wall:         Previously seen
 Nuchal Fold:           Previously seen        Cord Vessels:           Previously seen
 Face:                  Orbits and profile     Kidneys:                Appear normal
                        previously seen
 Lips:                  Previously seen        Bladder:                Appears normal
 Thoracic:              Appears normal         Spine:                  Previously seen
 Heart:                 Appears normal;        Upper Extremities:      Previously seen
                        EIF prev. seen
 RVOT:                  Previously seen        Lower Extremities:      Previously seen
 LVOT:                  Previously seen

 Other:  Heels/feet and open hands/5th digits previously  visualized. Nasal
         bone previously visualized. 3VV and 3VTV previously visualized.
Cervix Uterus Adnexa

 Cervix
 Not visualized (advanced GA >92wks)

 Uterus
 No abnormality visualized.

 Right Ovary
 No adnexal mass visualized.

 Left Ovary
 No adnexal mass visualized.

 Cul De Sac
 No free fluid seen.

 Adnexa
 No abnormality visualized.
Impression

 Follow up growth due to 5TNPP
 Normal interval growth with measurements consistent with
 dates
 Good fetal movement and amniotic fluid volume
Recommendations

 Follow up as clinically indicated.
